# Patient Record
Sex: Male | Born: 1985 | Race: White | Hispanic: No | Marital: Married | Smoking: Current every day smoker
Health system: Southern US, Community
[De-identification: ages and names within clinical notes are randomized; demographics above are authoritative.]

## PROBLEM LIST (undated history)

## (undated) DIAGNOSIS — F1110 Opioid abuse, uncomplicated: Secondary | ICD-10-CM

---
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
## 2010-09-12 ENCOUNTER — Ambulatory Visit: Payer: Self-pay | Admitting: Nurse Practitioner

## 2011-06-22 ENCOUNTER — Encounter (HOSPITAL_COMMUNITY): Payer: Self-pay | Admitting: *Deleted

## 2011-06-22 ENCOUNTER — Emergency Department (HOSPITAL_COMMUNITY)
Admission: EM | Admit: 2011-06-22 | Discharge: 2011-06-22 | Disposition: A | Discharge status: Home / Self Care | Payer: Self-pay | Attending: Emergency Medicine | Admitting: Emergency Medicine

## 2011-06-22 DIAGNOSIS — L0231 Cutaneous abscess of buttock: Secondary | ICD-10-CM | POA: Insufficient documentation

## 2011-06-22 DIAGNOSIS — F17200 Nicotine dependence, unspecified, uncomplicated: Secondary | ICD-10-CM | POA: Insufficient documentation

## 2011-06-22 MED ORDER — IBUPROFEN 800 MG PO TABS
800.0000 mg | ORAL_TABLET | Freq: Once | ORAL | Status: AC
  Administered 2011-06-22: 800 mg via ORAL
  Filled 2011-06-22: qty 1

## 2011-06-22 MED ORDER — ONDANSETRON HCL 4 MG PO TABS
4.0000 mg | ORAL_TABLET | Freq: Once | ORAL | Status: AC
  Administered 2011-06-22: 4 mg via ORAL
  Filled 2011-06-22: qty 1

## 2011-06-22 MED ORDER — DOXYCYCLINE HYCLATE 100 MG PO TABS
100.0000 mg | ORAL_TABLET | Freq: Once | ORAL | Status: AC
  Administered 2011-06-22: 100 mg via ORAL
  Filled 2011-06-22: qty 1

## 2011-06-22 MED ORDER — DOXYCYCLINE HYCLATE 100 MG PO CAPS: 100.0000 mg | ORAL_CAPSULE | Freq: Two times a day (BID) | ORAL | Status: AC

## 2011-06-22 MED ORDER — AMOXICILLIN-POT CLAVULANATE 875-125 MG PO TABS
1.0000 | ORAL_TABLET | Freq: Once | ORAL | Status: AC
  Administered 2011-06-22: 1 via ORAL
  Filled 2011-06-22: qty 1

## 2011-06-22 MED ORDER — HYDROCODONE-ACETAMINOPHEN 5-325 MG PO TABS: 1.0000 | ORAL_TABLET | ORAL | Status: AC | PRN

## 2011-06-22 MED ORDER — HYDROCODONE-ACETAMINOPHEN 5-325 MG PO TABS
2.0000 | ORAL_TABLET | Freq: Once | ORAL | Status: AC
  Administered 2011-06-22: 2 via ORAL
  Filled 2011-06-22: qty 2

## 2011-06-22 MED ORDER — ONDANSETRON HCL 4 MG PO TABS
ORAL_TABLET | ORAL | Status: AC
  Administered 2011-06-22: 4 mg
  Filled 2011-06-22: qty 1

## 2011-06-22 NOTE — ED Notes (Signed)
1 zofran 4 mg was dropped in floor.

## 2011-06-22 NOTE — ED Notes (Signed)
Redness, swelling to lt buttock x4-5 days,  Has black 1/4center.

## 2011-06-22 NOTE — ED Provider Notes (Signed)
History     CSN: [id number]  Arrival date & time 06/22/11  1735   First MD Initiated Contact with Patient 06/22/11 1826      Chief Complaint  Patient presents with  . Abscess    (Consider location/radiation/quality/duration/timing/severity/associated sxs/prior treatment) Patient is a 26 y.o. male presenting with abscess. The history is provided by the patient and the spouse.  Abscess  This is a new problem. The current episode started less than one week ago. The problem occurs continuously. The problem has been gradually worsening. The abscess is present on the left buttock. The problem is moderate. The abscess is characterized by redness and painfulness. Associated symptoms include decreased physical activity. Pertinent negatives include no vomiting and no cough. There were no sick contacts. He has received no recent medical care.    History reviewed. No pertinent past medical history.  History reviewed. No pertinent past surgical history.  No family history on file.  History  Substance Use Topics  . Smoking status: Current Everyday Smoker -- 2.0 packs/day  . Smokeless tobacco: Not on file  . Alcohol Use:       Review of Systems  Constitutional: Negative for activity change.       All ROS Neg except as noted in HPI  HENT: Negative for nosebleeds and neck pain.   Eyes: Negative for photophobia and discharge.  Respiratory: Negative for cough, shortness of breath and wheezing.   Cardiovascular: Negative for chest pain and palpitations.  Gastrointestinal: Negative for vomiting, abdominal pain and blood in stool.  Genitourinary: Negative for dysuria, frequency and hematuria.  Musculoskeletal: Negative for back pain and arthralgias.  Skin: Negative.   Neurological: Negative for dizziness, seizures and speech difficulty.  Psychiatric/Behavioral: Negative for hallucinations and confusion.    Allergies  Sulfa antibiotics  Home Medications   Current Outpatient Rx  Name  Route Sig Dispense Refill  . DOXYCYCLINE HYCLATE 100 MG PO CAPS Oral Take 1 capsule (100 mg total) by mouth 2 (two) times daily. 14 capsule 0  . HYDROCODONE-ACETAMINOPHEN 5-325 MG PO TABS Oral Take 1 tablet by mouth every 4 (four) hours as needed for pain. 15 tablet 0    BP 128/85  Pulse 98  Temp(Src) 98.1 F (36.7 C) (Oral)  Resp 20  Ht 5\' 8"  (1.727 m)  Wt 130 lb (58.968 kg)  BMI 19.77 kg/m2  SpO2 99%  Physical Exam  Nursing note and vitals reviewed. Constitutional: He is oriented to person, place, and time. He appears well-developed and well-nourished.  Non-toxic appearance.  HENT:  Head: Normocephalic.  Right Ear: Tympanic membrane and external ear normal.  Left Ear: Tympanic membrane and external ear normal.  Eyes: EOM and lids are normal. Pupils are equal, round, and reactive to light.  Neck: Normal range of motion. Neck supple. Carotid bruit is not present.  Cardiovascular: Normal rate, regular rhythm, normal heart sounds, intact distal pulses and normal pulses.   Pulmonary/Chest: Breath sounds normal. No respiratory distress.  Abdominal: Soft. Bowel sounds are normal. There is no tenderness. There is no guarding.  Genitourinary:       Red, raised, tender area of the left buttox. Anus not involved.  Musculoskeletal: Normal range of motion.  Lymphadenopathy:       Head (right side): No submandibular adenopathy present.       Head (left side): No submandibular adenopathy present.    He has no cervical adenopathy.  Neurological: He is alert and oriented to person, place, and time. He has normal strength.  No cranial nerve deficit or sensory deficit.  Skin: Skin is warm and dry.  Psychiatric: He has a normal mood and affect. His speech is normal.    ED Course  Procedures (including critical care time)  Labs Reviewed - No data to display No results found.   1. Abscess of buttock, left       MDM  I have reviewed nursing notes, vital signs, and all appropriate lab  and imaging results for this patient. The patient has an abscess of the left buttocks. The area is red, warm, raised, and tender. Attempted to aspirate the area however the patient is severely fearful of needles, and requests to use warm soaks and antibiotics first. Patient understands the possible risk involved and he except these risks.  The patient is advised to use warm tub soaks twice daily, he's given prescription for doxycycline 100 mg twice daily, he's given a prescription for Norco every 4 hours as needed for pain.       Kathie Dike, [place] 06/30/11 [phone]

## 2011-06-22 NOTE — ED Notes (Signed)
Boil on buttocks for 4 days

## 2011-06-22 NOTE — Discharge Instructions (Signed)
Please note the abscess area in warm salt water 2 times daily for about 15 minutes each time. Please use doxycycline 2 times daily with food until all taken. Use 3 ibuprofen tablets 3 times daily with a meal, to improve swelling and inflammation. Norco every 4 hours as needed for pain. This medication may cause drowsiness, please use with caution. If the abscess continues to progress, please see the surgeon listed above for assistance and drainage.Abscess An abscess (boil or furuncle) is an infected area under your skin. This area is filled with yellowish white fluid (pus). HOME CARE   Only take medicine as told by your doctor.   Keep the skin clean around your abscess. Keep clothes that may touch the abscess clean.   Change any bandages (dressings) as told by your doctor.   Avoid direct skin contact with other people. The infection can spread by skin contact with others.   Practice good hygiene and do not share personal care items.   Do not share athletic equipment, towels, or whirlpools. Shower after every practice or work out session.   If a draining area cannot be covered:   Do not play sports.   Children should not go to daycare until the wound has healed or until fluid (drainage) stops coming out of the wound.   See your doctor for a follow-up visit as told.  GET HELP RIGHT AWAY IF:   There is more pain, puffiness (swelling), and redness in the wound site.   There is fluid or bleeding from the wound site.   You have muscle aches, chills, fever, or feel sick.   You or your child has a temperature by mouth above 102 F (38.9 C), not controlled by medicine.   Your baby is older than 3 months with a rectal temperature of 102 F (38.9 C) or higher.  MAKE SURE YOU:   Understand these instructions.   Will watch your condition.   Will get help right away if you are not doing well or get worse.  Document Released: 09/09/2007 Document Revised: 03/12/2011 Document Reviewed:  09/09/2007 [facility] Patient Information 2012 [place], [place].Marland Kitchen

## 2011-07-03 NOTE — ED Provider Notes (Signed)
Medical screening examination/treatment/procedure(s) were performed by non-physician practitioner and as supervising physician I was immediately available for consultation/collaboration.   Shelda Jakes, MD 07/03/11 [phone]

## 2013-11-26 ENCOUNTER — Emergency Department (HOSPITAL_COMMUNITY): Payer: Self-pay

## 2013-11-26 ENCOUNTER — Encounter (HOSPITAL_COMMUNITY): Payer: Self-pay | Admitting: Emergency Medicine

## 2013-11-26 ENCOUNTER — Emergency Department (HOSPITAL_COMMUNITY)
Admission: EM | Admit: 2013-11-26 | Discharge: 2013-11-26 | Disposition: A | Discharge status: Home / Self Care | Payer: Self-pay | Attending: Emergency Medicine | Admitting: Emergency Medicine

## 2013-11-26 DIAGNOSIS — S43302A Subluxation of unspecified parts of left shoulder girdle, initial encounter: Secondary | ICD-10-CM

## 2013-11-26 DIAGNOSIS — W010XXA Fall on same level from slipping, tripping and stumbling without subsequent striking against object, initial encounter: Secondary | ICD-10-CM | POA: Insufficient documentation

## 2013-11-26 DIAGNOSIS — S46909A Unspecified injury of unspecified muscle, fascia and tendon at shoulder and upper arm level, unspecified arm, initial encounter: Secondary | ICD-10-CM | POA: Insufficient documentation

## 2013-11-26 DIAGNOSIS — Z791 Long term (current) use of non-steroidal anti-inflammatories (NSAID): Secondary | ICD-10-CM | POA: Insufficient documentation

## 2013-11-26 DIAGNOSIS — S0993XA Unspecified injury of face, initial encounter: Secondary | ICD-10-CM | POA: Insufficient documentation

## 2013-11-26 DIAGNOSIS — S199XXA Unspecified injury of neck, initial encounter: Secondary | ICD-10-CM

## 2013-11-26 DIAGNOSIS — F17200 Nicotine dependence, unspecified, uncomplicated: Secondary | ICD-10-CM | POA: Insufficient documentation

## 2013-11-26 DIAGNOSIS — Y9301 Activity, walking, marching and hiking: Secondary | ICD-10-CM | POA: Insufficient documentation

## 2013-11-26 DIAGNOSIS — S4980XA Other specified injuries of shoulder and upper arm, unspecified arm, initial encounter: Secondary | ICD-10-CM | POA: Insufficient documentation

## 2013-11-26 DIAGNOSIS — S43006A Unspecified dislocation of unspecified shoulder joint, initial encounter: Secondary | ICD-10-CM | POA: Insufficient documentation

## 2013-11-26 DIAGNOSIS — Y92009 Unspecified place in unspecified non-institutional (private) residence as the place of occurrence of the external cause: Secondary | ICD-10-CM | POA: Insufficient documentation

## 2013-11-26 MED ORDER — NAPROXEN 250 MG PO TABS
500.0000 mg | ORAL_TABLET | Freq: Once | ORAL | Status: AC
  Administered 2013-11-26: 500 mg via ORAL
  Filled 2013-11-26: qty 2

## 2013-11-26 MED ORDER — NAPROXEN 500 MG PO TABS: 500.0000 mg | ORAL_TABLET | Freq: Two times a day (BID) | ORAL | Status: DC

## 2013-11-26 MED ORDER — HYDROCODONE-ACETAMINOPHEN 5-325 MG PO TABS: 1.0000 | ORAL_TABLET | Freq: Four times a day (QID) | ORAL | Status: DC | PRN

## 2013-11-26 MED ORDER — HYDROCODONE-ACETAMINOPHEN 5-325 MG PO TABS
1.0000 | ORAL_TABLET | Freq: Once | ORAL | Status: AC
  Administered 2013-11-26: 1 via ORAL
  Filled 2013-11-26: qty 1

## 2013-11-26 NOTE — Discharge Instructions (Signed)
Wear the shoulder immobilizer until you can be rechecked by the orthopedist on call, Dr Romeo Apple. Call his office tomorrow to get an appointment. Wear the shoulder immobilzer to stabilize your shoulder. Use ice packs to the shoulder. Take the medication as prescribed.  Shoulder Joint Subluxation  SYMPTOMS   A "popping" or tearing sensation felt at the time of injury.  Severe pain in the shoulder at the time of injury.  Tenderness, swelling, warmth or redness, and later bruising over and around the shoulder.  Shoulder weakness or inability to use the shoulder properly.  Changed contour of the shoulder either visually or felt by touch. This is more evident when trying to contract the muscle or lift the arm.  Loss of firm fullness when pushing on the area where the tendon ruptured (a defect between the end of the muscle and bone where they are separated from each other). CAUSES   Sudden episode of stressful over activity, particularly a major force to an already maximally contracted deltoid muscle.  Direct blow or injury.  Possibly, throwing.  Shoulder surgery (particularly on the rotator cuff). RISK INCREASES WITH:   Sports that require excessive deltoid muscle stress, especially throwing sports.  Contact sports.  Poor shoulder strength and flexibility.  Previous shoulder injury or surgery requiring movement of the deltoid.  Oral anabolic steroid use. PREVENTION  Warm up and stretch properly before activity.  Allow for rest and recovery between activities.  Maintain physical fitness:  Cardiovascular fitness.  Shoulder flexibility.  Muscle strength and endurance. PROGNOSIS  This condition is usually curable with early and appropriate treatment. RELATED COMPLICATIONS   Weakness of the shoulder, especially if untreated.  Re-rupture of the muscle after treatment.  Prolonged disability.  Risks of surgery, including infection, injury to nerves (numbness, weakness,  or paralysis), bleeding, hematoma, shoulder stiffness, shoulder weakness, pain with strenuous activity, and recurrent disruption.  Loss of shoulder contour.  Inability to repair tendons TREATMENT  Treatment initially involves using medicine and ice to reduce pain and inflammation. Heat therapy may be used for small injuries. Strength and stretching exercises may be recommended and may be performed at home or with a therapist. For large ruptures, surgical interventions are usually required.  In order to have the best likelihood of a good outcome with surgery, it is important for the injury to be treated within a few weeks of injury. After surgery, the shoulder may be immobilized for a period of time and physical therapy may follow. MEDICATION   If pain medicine is necessary, nonsteroidal anti-inflammatory medicines, such as aspirin and ibuprofen, or other minor pain relievers, such as acetaminophen, are often recommended.  Do not take pain medicine for 7 days before surgery.  Prescription pain relievers may be given if determined necessary by your caregiver. Use only as directed and only as much as you need. COLD THERAPY  Cold treatment (icing) relieves pain and reduces inflammation. Cold treatment should be applied for 10 to 15 minutes every 2 to 3 hours for inflammation and pain and immediately after any activity that aggravates your symptoms. Use ice packs or an ice massage. SEEK MEDICAL CARE IF:   Pain increases, despite treatment.  Any of the following occur after surgery:  Signs of infection, including fever, increased pain, swelling, redness, drainage, or bleeding in the surgical area.  New, unexplained symptoms develop. Drugs used in treatment may produce side effects. Document Released: 03/23/2005 Document Revised: 06/15/2011 Document Reviewed: 07/05/2008 [facility] Patient Information 2015 [place], [place]. This information is not  intended to replace advice given to you by your health  care provider. Make sure you discuss any questions you have with your health care provider.

## 2013-11-26 NOTE — ED Notes (Addendum)
Pt states that he tripped over his son's skateboard landing on left shoulder, left neck area, denies any LOC, c/o pain to left shoulder and left neck area, states that the fall occurred 4 hours ago, cms intact distal, pt left shoulder lower than right shoulder, l,

## 2013-11-26 NOTE — ED Provider Notes (Signed)
CSN: [id number]     Arrival date & time 11/26/13  1414 History  This chart was scribed for Ward Givens, MD by Elon Spanner, ED Scribe. This patient was seen in room APA10/APA10 and the patient's care was started at 2:42 PM.    Chief Complaint  Patient presents with  . Fall   The history is provided by the patient. No language interpreter was used.    HPI Comments: Logan Wilkins is a 28 y.o. male who presents to the Emergency Department complaining of a mechanical fall 3-4 hours ago.  Patient states he tripped over his son's skateboard while walking in the back door of his house, landing on his left shoulder. He reports he can lift his left arm but states his shoulder pops when he lets his arm fall back down.  Patient reports associated left sided neck pain.  Patient denies LOC, head trauma.  Patient denies previous injury to left shoulder. He denies numbness or tingling. Pt is right handed.    PCP: none   History reviewed. No pertinent past medical history. History reviewed. No pertinent past surgical history. No family history on file. History  Substance Use Topics  . Smoking status: Current Every Day Smoker -- 2.00 packs/day  . Smokeless tobacco: Not on file  . Alcohol Use: No  employed Smokes up to 4 ppd, but minimum of 2 ppd  Review of Systems  Musculoskeletal: Positive for arthralgias and neck pain.  All other systems reviewed and are negative.     Allergies  Sulfa antibiotics  Home Medications   Prior to Admission medications   Medication Sig Start Date End Date Taking? Authorizing Provider  HYDROcodone-acetaminophen (NORCO/VICODIN) 5-325 MG per tablet Take 1 tablet by mouth every 6 (six) hours as needed for moderate pain. 11/26/13   Ward Givens, MD  naproxen (NAPROSYN) 500 MG tablet Take 1 tablet (500 mg total) by mouth 2 (two) times daily. 11/26/13   Ward Givens, MD   BP 125/90  Pulse 83  Temp(Src) 97.9 F (36.6 C) (Oral)  Resp 18  Ht  (1.727 m)  Wt 130  lb (58.968 kg)  BMI 19.77 kg/m2  SpO2 100%  Vital signs normal   Physical Exam  Nursing note and vitals reviewed. Constitutional: He is oriented to person, place, and time. He appears well-developed and well-nourished.  Non-toxic appearance. He does not appear ill. No distress.  HENT:  Head: Normocephalic and atraumatic.  Right Ear: External ear normal.  Left Ear: External ear normal.  Nose: Nose normal. No mucosal edema or rhinorrhea.  Mouth/Throat: Oropharynx is clear and moist and mucous membranes are normal. No dental abscesses or uvula swelling.  Eyes: Conjunctivae and EOM are normal. Pupils are equal, round, and reactive to light.  Neck: Normal range of motion and full passive range of motion without pain. Neck supple.    Cardiovascular: Normal rate and regular rhythm.   Pulmonary/Chest: Effort normal. No respiratory distress. He has no rhonchi. He exhibits no crepitus.  Abdominal: Soft. Normal appearance and bowel sounds are normal. He exhibits no distension. There is no tenderness. There is no rebound and no guarding.  Musculoskeletal: He exhibits tenderness. He exhibits no edema.  Tenderness to left paraspinous muscles of c-spine.  Tenderness in left AC joint with no obvious deformity or swelling.  However, when he does adduction of left arm, his shoulder appears to sublux and an audible pop is heard when it goes back into joint.  Neurological: He is alert and oriented to person, place, and time. He has normal strength. No cranial nerve deficit.  Skin: Skin is warm, dry and intact. No rash noted. No erythema. No pallor.  Psychiatric: He has a normal mood and affect. His speech is normal and behavior is normal. His mood appears not anxious.    ED Course  Procedures (including critical care time) Medications  naproxen (NAPROSYN) tablet 500 mg (500 mg Oral Given 11/26/13 1459)  HYDROcodone-acetaminophen (NORCO/VICODIN) 5-325 MG per tablet 1 tablet (1 tablet Oral Given 11/26/13  1459)     DIAGNOSTIC STUDIES: Oxygen Saturation is 100% on RA, normal by my interpretation.    COORDINATION OF CARE:  2:50 PM Discussed treatment plan with patient at bedside.  Patient acknowledges and agrees with plan.    Pt placed in a shoulder immobilizer after getting ACJ xrays with weights. His x-rays are normal. Patient must have a ligamentous injury of his shoulder that allows subluxation without dislocation. He is being referred to an orthopedist.  Labs Review No results found for this or any previous visit. No results found.    Imaging Review Dg Ac Joints  11/26/2013   CLINICAL DATA:  Left shoulder discomfort status post trauma  EXAM: LEFT ACROMIOCLAVICULAR JOINTS  COMPARISON:  Left shoulder series of today's date.  FINDINGS: Films were obtained of both shoulders without and with weights. The AC joints appears stable bilaterally. There is no acute fracture of the clavicles nor the visualized portions of the scapulae.  IMPRESSION: There is no evidence of ligamentous instability of the acromioclavicular joints.   Electronically Signed   By: David  [place]   On: 11/26/2013 15:28   Dg Shoulder Left  11/26/2013   CLINICAL DATA:  Pain post trauma  EXAM: LEFT SHOULDER - 2+ VIEW  COMPARISON:  None.  FINDINGS: Frontal, Y scapular, and axillary images were obtained. There is no fracture or dislocation. Joint spaces appear intact. No erosive change.  IMPRESSION: No fracture or appreciable arthropathy.   Electronically Signed   By: Bretta Bang M.D.   On: 11/26/2013 14:41     EKG Interpretation None      MDM   Final diagnoses:  Subluxation of shoulder girdle, left, initial encounter    Discharge Medication List as of 11/26/2013  3:38 PM    START taking these medications   Details  HYDROcodone-acetaminophen (NORCO/VICODIN) 5-325 MG per tablet Take 1 tablet by mouth every 6 (six) hours as needed for moderate pain., Starting 11/26/2013, Until Discontinued, Print    naproxen  (NAPROSYN) 500 MG tablet Take 1 tablet (500 mg total) by mouth 2 (two) times daily., Starting 11/26/2013, Until Discontinued, Print        Plan discharge  Devoria Albe, MD, FACEP    I personally performed the services described in this documentation, which was scribed in my presence. The recorded information has been reviewed and considered.  Devoria Albe, MD, Armando Gang    Ward Givens, MD 11/26/13 2139

## 2013-11-27 ENCOUNTER — Telehealth: Payer: Self-pay | Admitting: Orthopedic Surgery

## 2013-11-27 NOTE — Telephone Encounter (Signed)
Patient (wife, Elease Hashimoto) called to inquire about appointment following Emergency Room visit at [facility] for left shoulder injury.  Offered appointment, and discussed self-pay information; states she and husband(patient) will call back regarding arrangements for the appointment; also states applying for Medicaid.  Appointment pending.  Ph# [phone]

## 2013-12-05 ENCOUNTER — Encounter (HOSPITAL_COMMUNITY): Payer: Self-pay | Admitting: Emergency Medicine

## 2013-12-05 ENCOUNTER — Emergency Department (HOSPITAL_COMMUNITY)
Admission: EM | Admit: 2013-12-05 | Discharge: 2013-12-05 | Disposition: A | Discharge status: Home / Self Care | Payer: Self-pay | Attending: Emergency Medicine | Admitting: Emergency Medicine

## 2013-12-05 DIAGNOSIS — M25512 Pain in left shoulder: Secondary | ICD-10-CM

## 2013-12-05 DIAGNOSIS — Z791 Long term (current) use of non-steroidal anti-inflammatories (NSAID): Secondary | ICD-10-CM | POA: Insufficient documentation

## 2013-12-05 DIAGNOSIS — F17200 Nicotine dependence, unspecified, uncomplicated: Secondary | ICD-10-CM | POA: Insufficient documentation

## 2013-12-05 DIAGNOSIS — M25519 Pain in unspecified shoulder: Secondary | ICD-10-CM | POA: Insufficient documentation

## 2013-12-05 MED ORDER — CYCLOBENZAPRINE HCL 10 MG PO TABS: 10.0000 mg | ORAL_TABLET | Freq: Two times a day (BID) | ORAL | Status: DC | PRN

## 2013-12-05 NOTE — ED Provider Notes (Signed)
Medical screening examination/treatment/procedure(s) were performed by non-physician practitioner and as supervising physician I was immediately available for consultation/collaboration.   EKG Interpretation None      Devoria Albe, MD, Armando Gang   Ward Givens, MD 12/05/13 2051

## 2013-12-05 NOTE — Discharge Instructions (Signed)
Apply ice to the area, take the Naprosyn and the the medication we give you today. Do not drive while taking the medication as it will make you sleepy. Follow up with DR. Keeling or return here as needed.

## 2013-12-05 NOTE — ED Provider Notes (Signed)
CSN: [id number]     Arrival date & time 12/05/13  1731 History   First MD Initiated Contact with Patient 12/05/13 1758     Chief Complaint  Patient presents with  . Shoulder Pain     (Consider location/radiation/quality/duration/timing/severity/associated sxs/prior Treatment) Patient is a 28 y.o. male presenting with shoulder pain. The history is provided by the patient.  Shoulder Pain  Logan Wilkins is a 28 y.o. male who presents to the ED with continued shoulder pain. He was evaluated 8/23 for left shoulder pain and treated with Norco and Naprosyn. He called Dr. Mort Sawyers office for follow up but did not have the money to go for an appointment. He continues to have the pain and is out of his Norco. He is requesting pain medication.   History reviewed. No pertinent past medical history. History reviewed. No pertinent past surgical history. History reviewed. No pertinent family history. History  Substance Use Topics  . Smoking status: Current Every Day Smoker -- 2.00 packs/day  . Smokeless tobacco: Not on file  . Alcohol Use: Yes    Review of Systems Negative except as stated in HPI   Allergies  Sulfa antibiotics  Home Medications   Prior to Admission medications   Medication Sig Start Date End Date Taking? Authorizing Provider  HYDROcodone-acetaminophen (NORCO/VICODIN) 5-325 MG per tablet Take 1 tablet by mouth every 6 (six) hours as needed for moderate pain. 11/26/13   Ward Givens, MD  naproxen (NAPROSYN) 500 MG tablet Take 1 tablet (500 mg total) by mouth 2 (two) times daily. 11/26/13   Ward Givens, MD   BP 140/82  Pulse 96  Temp(Src) 98.3 F (36.8 C) (Oral)  Resp 18  Ht  (1.727 m)  Wt 130 lb (58.968 kg)  BMI 19.77 kg/m2  SpO2 100% Physical Exam  Nursing note and vitals reviewed. Constitutional: He is oriented to person, place, and time. He appears well-developed and well-nourished.  HENT:  Head: Normocephalic.  Eyes: EOM are normal.  Neck: Neck supple.    Cardiovascular: Normal rate.   Pulmonary/Chest: Effort normal.  Musculoskeletal:       Left shoulder: He exhibits tenderness.       Arms: There is tenderness and muscle spasm noted on the left side of the neck and into the SCM area. Radial pulses equal, adequate circulation, good touch sensation. Good grips and equal. Pain with range of motion. He is able to lift his arm above his head and bring it to the front of his body. He has pain with trying to put his left arm behind his back.   Neurological: He is alert and oriented to person, place, and time. No cranial nerve deficit.  Skin: Skin is warm and dry.  Psychiatric: He has a normal mood and affect. His behavior is normal.    ED Course  Procedures MDM  28 y.o. male with continued left shoulder pain since previous visit. Will treat for muscle spasm and he will try to arrange follow up with ortho. He will continue to take the Naprosyn for inflammation. Discussed with the patient and all questioned fully answered. Stable for discharge without neurovascular deficits.    Medication List    TAKE these medications       cyclobenzaprine 10 MG tablet  Commonly known as:  FLEXERIL  Take 1 tablet (10 mg total) by mouth 2 (two) [id number] as needed for muscle spasms.      ASK your doctor about these medications  HYDROcodone-acetaminophen 5-325 MG per tablet  Commonly known as:  NORCO/VICODIN  Take 1 tablet by mouth every 6 (six) hours as needed for moderate pain.     naproxen 500 MG tablet  Commonly known as:  NAPROSYN  Take 1 tablet (500 mg total) by mouth 2 (two) times daily.            Logan Wilkins, [place] 12/05/13 [phone]

## 2013-12-05 NOTE — ED Notes (Signed)
Pain lt shoulder , seen here 8/23 for same. Referred to ortho , but says he cannot afford that.  Wants something for pain.

## 2013-12-06 NOTE — Telephone Encounter (Signed)
No further response from patient. °

## 2014-06-24 ENCOUNTER — Emergency Department (HOSPITAL_COMMUNITY): Payer: Self-pay

## 2014-06-24 ENCOUNTER — Emergency Department (HOSPITAL_COMMUNITY)
Admission: EM | Admit: 2014-06-24 | Discharge: 2014-06-24 | Disposition: A | Discharge status: Home / Self Care | Payer: Self-pay | Attending: Emergency Medicine | Admitting: Emergency Medicine

## 2014-06-24 ENCOUNTER — Encounter (HOSPITAL_COMMUNITY): Payer: Self-pay | Admitting: *Deleted

## 2014-06-24 DIAGNOSIS — Y9289 Other specified places as the place of occurrence of the external cause: Secondary | ICD-10-CM | POA: Insufficient documentation

## 2014-06-24 DIAGNOSIS — Z720 Tobacco use: Secondary | ICD-10-CM | POA: Insufficient documentation

## 2014-06-24 DIAGNOSIS — M25512 Pain in left shoulder: Secondary | ICD-10-CM

## 2014-06-24 DIAGNOSIS — Y9351 Activity, roller skating (inline) and skateboarding: Secondary | ICD-10-CM | POA: Insufficient documentation

## 2014-06-24 DIAGNOSIS — S4992XA Unspecified injury of left shoulder and upper arm, initial encounter: Secondary | ICD-10-CM | POA: Insufficient documentation

## 2014-06-24 DIAGNOSIS — Y998 Other external cause status: Secondary | ICD-10-CM | POA: Insufficient documentation

## 2014-06-24 DIAGNOSIS — W010XXA Fall on same level from slipping, tripping and stumbling without subsequent striking against object, initial encounter: Secondary | ICD-10-CM

## 2014-06-24 MED ORDER — OXYCODONE-ACETAMINOPHEN 5-325 MG PO TABS
1.0000 | ORAL_TABLET | Freq: Once | ORAL | Status: AC
  Administered 2014-06-24: 1 via ORAL

## 2014-06-24 MED ORDER — OXYCODONE-ACETAMINOPHEN 5-325 MG PO TABS
ORAL_TABLET | ORAL | Status: AC
  Filled 2014-06-24: qty 1

## 2014-06-24 NOTE — Discharge Instructions (Signed)
Apply ice to your shoulder. Continue to take ibuprofen, naproxen or tylenol for pain.  Shoulder Pain The shoulder is the joint that connects your arms to your body. The bones that form the shoulder joint include the upper arm bone (humerus), the shoulder blade (scapula), and the collarbone (clavicle). The top of the humerus is shaped like a ball and fits into a rather flat socket on the scapula (glenoid cavity). A combination of muscles and strong, fibrous tissues that connect muscles to bones (tendons) support your shoulder joint and hold the ball in the socket. Small, fluid-filled sacs (bursae) are located in different areas of the joint. They act as cushions between the bones and the overlying soft tissues and help reduce friction between the gliding tendons and the bone as you move your arm. Your shoulder joint allows a wide range of motion in your arm. This range of motion allows you to do things like scratch your back or throw a ball. However, this range of motion also makes your shoulder more prone to pain from overuse and injury. Causes of shoulder pain can originate from both injury and overuse and usually can be grouped in the following four categories:  Redness, swelling, and pain (inflammation) of the tendon (tendinitis) or the bursae (bursitis).  Instability, such as a dislocation of the joint.  Inflammation of the joint (arthritis).  Broken bone (fracture). HOME CARE INSTRUCTIONS   Apply ice to the sore area.  Put ice in a plastic bag.  Place a towel between your skin and the bag.  Leave the ice on for 15-20 minutes, 3-4 times per day for the first 2 days, or as directed by your health care provider.  Stop using cold packs if they do not help with the pain.  If you have a shoulder sling or immobilizer, wear it as long as your caregiver instructs. Only remove it to shower or bathe. Move your arm as little as possible, but keep your hand moving to prevent swelling.  Squeeze a  soft ball or foam pad as much as possible to help prevent swelling.  Only take over-the-counter or prescription medicines for pain, discomfort, or fever as directed by your caregiver. SEEK MEDICAL CARE IF:   Your shoulder pain increases, or new pain develops in your arm, hand, or fingers.  Your hand or fingers become cold and numb.  Your pain is not relieved with medicines. SEEK IMMEDIATE MEDICAL CARE IF:   Your arm, hand, or fingers are numb or tingling.  Your arm, hand, or fingers are significantly swollen or turn white or blue. MAKE SURE YOU:   Understand these instructions.  Will watch your condition.  Will get help right away if you are not doing well or get worse. Document Released: 12/31/2004 Document Revised: 08/07/2013 Document Reviewed: 03/07/2011 [facility] Patient Information 2015 [employer] [place]. This information is not intended to replace advice given to you by your health care provider. Make sure you discuss any questions you have with your health care provider.

## 2014-06-24 NOTE — ED Provider Notes (Signed)
CSN: [id number]     Arrival date & time 06/24/14  1434 History  This chart was scribed for non-physician practitioner, Celene Skeen, PA-C,working with Eber Hong, MD, by Karle Plumber, ED Scribe. This patient was seen in room TR08C/TR08C and the patient's care was started at 4:21 PM.  Chief Complaint  Patient presents with  . Fall  . Shoulder Pain   Patient is a 29 y.o. male presenting with fall and shoulder pain. The history is provided by the patient and medical records. No language interpreter was used.  Fall  Shoulder Pain   HPI Comments:  Logan Wilkins is a 29 y.o. male who presents to the Emergency Department complaining of severe left shoulder pain secondary to stepping on a skateboard and falling directly on the shoulder approximately four hours ago. He reports associated left hand paresthesia. He took Naproxen PTA with no significant relief of the pain. Holding the arm still helps to alleviate the pain. Movement of the arm exacerbates the pain. Pt reports h/o past subluxation. Denies numbness, bruising, swelling, wounds, nausea, vomiting, head trauma or LOC.    History reviewed. No pertinent past medical history. History reviewed. No pertinent past surgical history. History reviewed. No pertinent family history. History  Substance Use Topics  . Smoking status: Current Every Day Smoker -- 2.00 packs/day  . Smokeless tobacco: Not on file  . Alcohol Use: Yes    Review of Systems  Gastrointestinal: Negative for nausea and vomiting.  Musculoskeletal: Positive for myalgias.  Skin: Negative for color change and wound.  Neurological: Negative for syncope, weakness and numbness.  All other systems reviewed and are negative.   Allergies  Sulfa antibiotics  Home Medications   Prior to Admission medications   Medication Sig Start Date End Date Taking? Authorizing Provider  cyclobenzaprine (FLEXERIL) 10 MG tablet Take 1 tablet (10 mg total) by mouth 2 (two) times daily as  needed for muscle spasms. 12/05/13   Hope Orlene Och, NP  HYDROcodone-acetaminophen (NORCO/VICODIN) 5-325 MG per tablet Take 1 tablet by mouth every 6 (six) hours as needed for moderate pain. 11/26/13   Devoria Albe, MD  naproxen (NAPROSYN) 500 MG tablet Take 1 tablet (500 mg total) by mouth 2 (two) times daily. 11/26/13   Devoria Albe, MD   Triage Vitals: BP 130/84 mmHg  Pulse 98  Temp(Src) 98.7 F (37.1 C) (Oral)  Resp 18  SpO2 98% Physical Exam  Constitutional: He is oriented to person, place, and time. He appears well-developed and well-nourished. No distress.  HENT:  Head: Normocephalic and atraumatic.  Eyes: Conjunctivae and EOM are normal.  Neck: Normal range of motion. Neck supple.  Cardiovascular: Normal rate, regular rhythm and normal heart sounds.   Pulmonary/Chest: Effort normal and breath sounds normal.  Musculoskeletal: He exhibits no edema.  L shoulder TTP throughout. No bruising or signs of trauma. No swelling or deformity. Pt using muscle strength to prevent me to perform passive ROM. Clavicle normal. Elbow normal.  Neurological: He is alert and oriented to person, place, and time.  Skin: Skin is warm and dry.  Psychiatric: He has a normal mood and affect. His behavior is normal.  Nursing note and vitals reviewed.   ED Course  Procedures (including critical care time) DIAGNOSTIC STUDIES: Oxygen Saturation is 98% on RA, normal by my interpretation.   COORDINATION OF CARE: 4:24 PM- Before leaving the room pt asks why his shoulder makes a popping sound and proceeds to fully rotate his shoulder with FROM until it makes  a popping sound. Pt verbalizes understanding and agrees to plan.  Medications  oxyCODONE-acetaminophen (PERCOCET/ROXICET) 5-325 MG per tablet (not administered)  oxyCODONE-acetaminophen (PERCOCET/ROXICET) 5-325 MG per tablet 1 tablet (1 tablet Oral Given 06/24/14 1506)    Labs Review Labs Reviewed - No data to display  Imaging Review Dg Shoulder  Left  06/24/2014   CLINICAL DATA:  Fall from skateboard with left shoulder pain  EXAM: LEFT SHOULDER - 2+ VIEW  COMPARISON:  11/26/2013  FINDINGS: There is no evidence of fracture or dislocation. There is no evidence of arthropathy or other focal bone abnormality. Soft tissues are unremarkable.  IMPRESSION: No acute abnormality noted.   Electronically Signed   By: Alcide CleverMark  Lukens M.D.   On: 06/24/2014 15:43     EKG Interpretation None      MDM   Final diagnoses:  Fall from slip, trip, or stumble, initial encounter  Left shoulder pain   NAD. Neurovascularly intact. Xray without acute finding. Discussed results with patient. Using muscle strength to prevent ROM, however when I was about to leave room, he asks why his shoulder makes a popping sound as stated above, swings his arm without any difficulty to make the sound. I advised him to f/u with PCP and possibly ortho if symptoms do not improve. Stable for d/c. Ice and NSAIDs. Return precautions given. Patient states understanding of treatment care plan and is agreeable.  I personally performed the services described in this documentation, which was scribed in my presence. The recorded information has been reviewed and is accurate.    Kathrynn SpeedRobyn M Jaleeya Mcnelly, PA-C 06/24/14 1707  Eber HongBrian Miller, MD 06/25/14 [phone]

## 2014-06-24 NOTE — ED Notes (Signed)
Pt reports tripping over skateboard this am, having left shoulder pain, difficulty moving his arm.

## 2014-06-24 NOTE — ED Notes (Signed)
Declined W/C at D/C and was escorted to lobby by RN. 

## 2014-07-29 ENCOUNTER — Emergency Department
Admit: 2014-07-29 | Disposition: A | Discharge status: Home / Self Care | Payer: Self-pay | Admitting: Emergency Medicine

## 2015-11-30 ENCOUNTER — Emergency Department
Admission: EM | Admit: 2015-11-30 | Discharge: 2015-11-30 | Disposition: A | Discharge status: Home / Self Care | Payer: Self-pay | Attending: Emergency Medicine | Admitting: Emergency Medicine

## 2015-11-30 ENCOUNTER — Emergency Department: Payer: Self-pay

## 2015-11-30 DIAGNOSIS — Y9389 Activity, other specified: Secondary | ICD-10-CM | POA: Insufficient documentation

## 2015-11-30 DIAGNOSIS — Y999 Unspecified external cause status: Secondary | ICD-10-CM | POA: Insufficient documentation

## 2015-11-30 DIAGNOSIS — F17210 Nicotine dependence, cigarettes, uncomplicated: Secondary | ICD-10-CM | POA: Insufficient documentation

## 2015-11-30 DIAGNOSIS — IMO0002 Reserved for concepts with insufficient information to code with codable children: Secondary | ICD-10-CM

## 2015-11-30 DIAGNOSIS — W25XXXA Contact with sharp glass, initial encounter: Secondary | ICD-10-CM | POA: Insufficient documentation

## 2015-11-30 DIAGNOSIS — S51811A Laceration without foreign body of right forearm, initial encounter: Secondary | ICD-10-CM | POA: Insufficient documentation

## 2015-11-30 DIAGNOSIS — S61235A Puncture wound without foreign body of left ring finger without damage to nail, initial encounter: Secondary | ICD-10-CM | POA: Insufficient documentation

## 2015-11-30 DIAGNOSIS — R202 Paresthesia of skin: Secondary | ICD-10-CM

## 2015-11-30 DIAGNOSIS — T148XXA Other injury of unspecified body region, initial encounter: Secondary | ICD-10-CM

## 2015-11-30 DIAGNOSIS — Y92009 Unspecified place in unspecified non-institutional (private) residence as the place of occurrence of the external cause: Secondary | ICD-10-CM | POA: Insufficient documentation

## 2015-11-30 MED ORDER — OXYCODONE-ACETAMINOPHEN 5-325 MG PO TABS
1.0000 | ORAL_TABLET | Freq: Once | ORAL | Status: AC
  Administered 2015-11-30: 1 via ORAL
  Filled 2015-11-30: qty 1

## 2015-11-30 MED ORDER — LIDOCAINE-EPINEPHRINE 2 %-1:100000 IJ SOLN
20.0000 mL | Freq: Once | INTRAMUSCULAR | Status: DC
  Filled 2015-11-30: qty 20

## 2015-11-30 MED ORDER — LIDOCAINE-EPINEPHRINE (PF) 1 %-1:200000 IJ SOLN: 30.0000 mL | Freq: Once | INTRAMUSCULAR | Status: DC

## 2015-11-30 MED ORDER — LIDOCAINE-EPINEPHRINE (PF) 1 %-1:200000 IJ SOLN
INTRAMUSCULAR | Status: AC
  Filled 2015-11-30: qty 30

## 2015-11-30 NOTE — Discharge Instructions (Signed)
You may shower, but do not soak the arm with your stitches. Please apply Neosporin or any triple antibiotic cream and a thick coat 3 times daily to decrease the risk of infection. Please keep your lacerations clean and dry.  Make an appointment with a primary care physician for suture removal and wound check in 7-10 days.  You may take Tylenol or Motrin for pain.  Seek immediate medical attention if you develop drainage of pus, increasing redness, swelling, increasing pain, fever, or any other symptoms concerning to you.

## 2015-11-30 NOTE — ED Triage Notes (Signed)
Pt came to ED via EMS. Pt was taking window pane out and glass broke. Pt has laceration to right forearm. Small puncture wound to 4th finger on left hand. Right arm wrapped by EMS.
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
## 2015-12-17 ENCOUNTER — Encounter: Payer: Self-pay | Admitting: *Deleted

## 2015-12-17 ENCOUNTER — Emergency Department: Payer: Self-pay

## 2015-12-17 ENCOUNTER — Emergency Department
Admission: EM | Admit: 2015-12-17 | Discharge: 2015-12-18 | Disposition: A | Discharge status: Still In Hospital | Payer: Self-pay | Attending: Emergency Medicine | Admitting: Emergency Medicine

## 2015-12-17 DIAGNOSIS — S0003XA Contusion of scalp, initial encounter: Secondary | ICD-10-CM | POA: Insufficient documentation

## 2015-12-17 DIAGNOSIS — S0990XA Unspecified injury of head, initial encounter: Secondary | ICD-10-CM | POA: Insufficient documentation

## 2015-12-17 DIAGNOSIS — F39 Unspecified mood [affective] disorder: Secondary | ICD-10-CM | POA: Insufficient documentation

## 2015-12-17 DIAGNOSIS — F1210 Cannabis abuse, uncomplicated: Secondary | ICD-10-CM | POA: Insufficient documentation

## 2015-12-17 DIAGNOSIS — F1010 Alcohol abuse, uncomplicated: Secondary | ICD-10-CM | POA: Insufficient documentation

## 2015-12-17 DIAGNOSIS — X58XXXA Exposure to other specified factors, initial encounter: Secondary | ICD-10-CM | POA: Insufficient documentation

## 2015-12-17 DIAGNOSIS — F17210 Nicotine dependence, cigarettes, uncomplicated: Secondary | ICD-10-CM | POA: Insufficient documentation

## 2015-12-17 DIAGNOSIS — R45851 Suicidal ideations: Secondary | ICD-10-CM | POA: Insufficient documentation

## 2015-12-17 LAB — COMPREHENSIVE METABOLIC PANEL
ALBUMIN: 4.5 g/dL (ref 3.5–5.0)
ALK PHOS: 88 U/L (ref 38–126)
ALT: 33 U/L (ref 17–63)
AST: 48 U/L — AB (ref 15–41)
Anion gap: 7 (ref 5–15)
BILIRUBIN TOTAL: 0.6 mg/dL (ref 0.3–1.2)
BUN: 10 mg/dL (ref 6–20)
CALCIUM: 8.8 mg/dL — AB (ref 8.9–10.3)
CO2: 24 mmol/L (ref 22–32)
Chloride: 108 mmol/L (ref 101–111)
Creatinine, Ser: 0.84 mg/dL (ref 0.61–1.24)
GFR calc Af Amer: >60 mL/min (ref >60)
GFR calc non Af Amer: >60 mL/min (ref >60)
GLUCOSE: 103 mg/dL — AB (ref 65–99)
Potassium: 3.3 mmol/L — ABNORMAL LOW (ref 3.5–5.1)
SODIUM: 139 mmol/L (ref 135–145)
TOTAL PROTEIN: 8 g/dL (ref 6.5–8.1)

## 2015-12-17 LAB — CBC WITH DIFFERENTIAL/PLATELET
BASOS ABS: 0.1 10*3/uL (ref 0–0.1)
BASOS PCT: 0 %
Eosinophils Absolute: 0 10*3/uL (ref 0–0.7)
Eosinophils Relative: 0 %
HEMATOCRIT: 46.2 % (ref 40.0–52.0)
HEMOGLOBIN: 16 g/dL (ref 13.0–18.0)
LYMPHS PCT: 12 %
Lymphs Abs: 1.8 10*3/uL (ref 1.0–3.6)
MCH: 32.2 pg (ref 26.0–34.0)
MCHC: 34.7 g/dL (ref 32.0–36.0)
MCV: 92.8 fL (ref 80.0–100.0)
MONO ABS: 1.1 10*3/uL — AB (ref 0.2–1.0)
MONOS PCT: 7 %
NEUTROS ABS: 11.7 10*3/uL — AB (ref 1.4–6.5)
NEUTROS PCT: 81 %
Platelets: 228 10*3/uL (ref 150–440)
RBC: 4.98 MIL/uL (ref 4.40–5.90)
RDW: 13.2 % (ref 11.5–14.5)
WBC: 14.6 10*3/uL — ABNORMAL HIGH (ref 3.8–10.6)

## 2015-12-17 LAB — TROPONIN I: Troponin I: <0.03 ng/mL (ref <0.03)

## 2015-12-17 LAB — ETHANOL: Alcohol, Ethyl (B): 186 mg/dL — ABNORMAL HIGH (ref <5)

## 2015-12-17 LAB — LIPASE, BLOOD: Lipase: 34 U/L (ref 11–51)

## 2015-12-17 LAB — SALICYLATE LEVEL

## 2015-12-17 LAB — ACETAMINOPHEN LEVEL: Acetaminophen (Tylenol), Serum: <10 ug/mL — ABNORMAL LOW (ref 10–30)

## 2015-12-17 NOTE — ED Notes (Signed)
MD at bedside. 
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
## 2015-12-18 ENCOUNTER — Inpatient Hospital Stay
Admission: AD | Admit: 2015-12-18 | Discharge: 2015-12-21 | DRG: 881 | Disposition: A | Discharge status: Home / Self Care | Payer: No Typology Code available for payment source | Source: Intra-hospital | Attending: Psychiatry | Admitting: Psychiatry

## 2015-12-18 DIAGNOSIS — F329 Major depressive disorder, single episode, unspecified: Secondary | ICD-10-CM | POA: Diagnosis not present

## 2015-12-18 DIAGNOSIS — F1220 Cannabis dependence, uncomplicated: Secondary | ICD-10-CM

## 2015-12-18 DIAGNOSIS — F17210 Nicotine dependence, cigarettes, uncomplicated: Secondary | ICD-10-CM | POA: Diagnosis present

## 2015-12-18 DIAGNOSIS — Z882 Allergy status to sulfonamides status: Secondary | ICD-10-CM | POA: Diagnosis not present

## 2015-12-18 DIAGNOSIS — F1020 Alcohol dependence, uncomplicated: Secondary | ICD-10-CM | POA: Diagnosis present

## 2015-12-18 DIAGNOSIS — Y906 Blood alcohol level of 120-199 mg/100 ml: Secondary | ICD-10-CM | POA: Diagnosis present

## 2015-12-18 DIAGNOSIS — Z599 Problem related to housing and economic circumstances, unspecified: Secondary | ICD-10-CM | POA: Diagnosis not present

## 2015-12-18 DIAGNOSIS — Z630 Problems in relationship with spouse or partner: Secondary | ICD-10-CM

## 2015-12-18 DIAGNOSIS — R440 Auditory hallucinations: Secondary | ICD-10-CM | POA: Diagnosis present

## 2015-12-18 DIAGNOSIS — F1120 Opioid dependence, uncomplicated: Secondary | ICD-10-CM

## 2015-12-18 DIAGNOSIS — F1994 Other psychoactive substance use, unspecified with psychoactive substance-induced mood disorder: Secondary | ICD-10-CM

## 2015-12-18 DIAGNOSIS — R45851 Suicidal ideations: Secondary | ICD-10-CM | POA: Diagnosis present

## 2015-12-18 DIAGNOSIS — G4700 Insomnia, unspecified: Secondary | ICD-10-CM | POA: Diagnosis present

## 2015-12-18 DIAGNOSIS — F1123 Opioid dependence with withdrawal: Secondary | ICD-10-CM | POA: Diagnosis present

## 2015-12-18 DIAGNOSIS — F17200 Nicotine dependence, unspecified, uncomplicated: Secondary | ICD-10-CM

## 2015-12-18 DIAGNOSIS — F1193 Opioid use, unspecified with withdrawal: Secondary | ICD-10-CM

## 2015-12-18 DIAGNOSIS — F32A Depression, unspecified: Secondary | ICD-10-CM

## 2015-12-18 LAB — URINALYSIS COMPLETE WITH MICROSCOPIC (ARMC ONLY)
BACTERIA UA: NONE SEEN
Bilirubin Urine: NEGATIVE
Glucose, UA: NEGATIVE mg/dL
LEUKOCYTES UA: NEGATIVE
NITRITE: NEGATIVE
PH: 6 (ref 5.0–8.0)
PROTEIN: 30 mg/dL — AB
SPECIFIC GRAVITY, URINE: 1.012 (ref 1.005–1.030)
SQUAMOUS EPITHELIAL / LPF: NONE SEEN

## 2015-12-18 LAB — URINE DRUG SCREEN, QUALITATIVE (ARMC ONLY)
Amphetamines, Ur Screen: NOT DETECTED
BARBITURATES, UR SCREEN: NOT DETECTED
BENZODIAZEPINE, UR SCRN: NOT DETECTED
CANNABINOID 50 NG, UR ~~LOC~~: POSITIVE — AB
Cocaine Metabolite,Ur ~~LOC~~: NOT DETECTED
MDMA (Ecstasy)Ur Screen: NOT DETECTED
Methadone Scn, Ur: NOT DETECTED
Opiate, Ur Screen: NOT DETECTED
PHENCYCLIDINE (PCP) UR S: NOT DETECTED
TRICYCLIC, UR SCREEN: NOT DETECTED

## 2015-12-18 MED ORDER — ALUM & MAG HYDROXIDE-SIMETH 200-200-20 MG/5ML PO SUSP: 30.0000 mL | ORAL | Status: DC | PRN

## 2015-12-18 MED ORDER — MAGNESIUM HYDROXIDE 400 MG/5ML PO SUSP: 30.0000 mL | Freq: Every day | ORAL | Status: DC | PRN

## 2015-12-18 MED ORDER — PROSIGHT PO TABS
1.0000 | ORAL_TABLET | Freq: Every day | ORAL | Status: DC
  Filled 2015-12-18: qty 1

## 2015-12-18 MED ORDER — LORAZEPAM 2 MG/ML IJ SOLN: 0.0000 mg | Freq: Two times a day (BID) | INTRAMUSCULAR | Status: DC

## 2015-12-18 MED ORDER — THIAMINE HCL 100 MG/ML IJ SOLN
100.0000 mg | Freq: Every day | INTRAMUSCULAR | Status: DC
  Administered 2015-12-18: 100 mg via INTRAVENOUS
  Filled 2015-12-18: qty 2

## 2015-12-18 MED ORDER — ACETAMINOPHEN 325 MG PO TABS: 650.0000 mg | ORAL_TABLET | Freq: Four times a day (QID) | ORAL | Status: DC | PRN

## 2015-12-18 MED ORDER — LORAZEPAM 2 MG PO TABS
0.0000 mg | ORAL_TABLET | Freq: Four times a day (QID) | ORAL | Status: DC
  Administered 2015-12-18 (×2): 2 mg via ORAL
  Filled 2015-12-18 (×2): qty 1

## 2015-12-18 MED ORDER — LORAZEPAM 2 MG PO TABS: 0.0000 mg | ORAL_TABLET | Freq: Two times a day (BID) | ORAL | Status: DC

## 2015-12-18 MED ORDER — LORAZEPAM 2 MG PO TABS: 2.0000 mg | ORAL_TABLET | Freq: Three times a day (TID) | ORAL | Status: DC | PRN

## 2015-12-18 MED ORDER — OCUVITE-LUTEIN PO CAPS
1.0000 | ORAL_CAPSULE | Freq: Every day | ORAL | Status: DC
  Administered 2015-12-18: 1 via ORAL
  Filled 2015-12-18: qty 1

## 2015-12-18 MED ORDER — THIAMINE HCL 100 MG/ML IJ SOLN: 100.0000 mg | Freq: Every day | INTRAMUSCULAR | Status: DC

## 2015-12-18 MED ORDER — LORAZEPAM 2 MG/ML IJ SOLN: 0.0000 mg | Freq: Four times a day (QID) | INTRAMUSCULAR | Status: DC

## 2015-12-18 MED ORDER — LORAZEPAM 2 MG PO TABS: 0.0000 mg | ORAL_TABLET | Freq: Four times a day (QID) | ORAL | Status: DC
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
## 2015-12-18 NOTE — ED Notes (Signed)
Upon rounding with the pt, pt is very diaphoretic, shaking.. Pt is sitting up in the bed eating peanut butter and crackers.. Asked the pt if he drinks ETOh on a daily basis, pt states he does not drink daily but takes other drugs off the street.. Only cannabinoid noted positive on UDS.. MD notified and CIWA initiated on pt..Logan Wilkins

## 2015-12-18 NOTE — ED Notes (Signed)
Psych MD at bedside

## 2015-12-18 NOTE — [facility] Group Notes (Signed)
[facility] Group Notes:  (Nursing/MHT/Case Management/Adjunct)  Date:  12/18/2015  Time:  6:04 PM  Type of Therapy:  Psychoeducational Skills  Participation Level:  Did Not Attend   Jessamy Torosyan Travis Odas Ozer 12/18/2015, 6:04 PM 

## 2015-12-18 NOTE — ED Notes (Signed)
Patient sleeping at this time.

## 2015-12-18 NOTE — ED Notes (Signed)
Patient sitting in bed eating a snack.

## 2015-12-18 NOTE — ED Notes (Signed)
Patient still sleeping at this time.  

## 2015-12-18 NOTE — ED Notes (Signed)
[name] TTS in with pt at present.
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
## 2015-12-18 NOTE — [facility] Group Notes (Signed)
[facility] Group Notes:  (Nursing/MHT/Case Management/Adjunct)  Date:  12/18/2015  Time:  9:29 PM  Type of Therapy:  Evening Wrap-up Group  Participation Level:  Did Not Attend  Participation Quality:  N/A  Affect:  N/A  Cognitive:  N/A  Insight:  None  Engagement in Group:  Did Not Attend  Modes of Intervention:  Activity  Summary of Progress/Problems:  Tomasita MorrowChelsea Nanta Dessie Tatem 12/18/2015, 9:29 PM

## 2015-12-18 NOTE — Tx Team (Signed)
Initial Treatment Plan 12/18/2015 2:16 PM Logan Schleinliver B Desch [id number]    PATIENT STRESSORS: Financial difficulties Marital or family conflict Substance abuse   PATIENT STRENGTHS: Capable of independent living Communication skills Physical Health   PATIENT IDENTIFIED PROBLEMS: Substance abuse     Substance abuse    Suicidal ideas              DISCHARGE CRITERIA:  Adequate post-discharge living arrangements Improved stabilization in mood, thinking, and/or behavior Motivation to continue treatment in a less acute level of care  PRELIMINARY DISCHARGE PLAN: Attend aftercare/continuing care group Return to previous living arrangement  PATIENT/FAMILY INVOLVEMENT: This treatment plan has been presented to and reviewed with the patient, Logan SchleinOliver B Wilkins, and/or family member, .  The patient and family have been given the opportunity to ask questions and make suggestions.  Ignacia FellingJennifer A Rodricus Candelaria, RN 12/18/2015, 2:16 PM

## 2015-12-18 NOTE — ED Notes (Signed)
Pt is laying in bed on back with eyes closed and appears to be asleep - resting quietly - introduced self to pt and sitter - room is secure and sitter at bedside

## 2015-12-18 NOTE — BH Assessment (Addendum)
[facility] Dr. Toni Amendlapacs.  Attending Physician will be Dr. Ardyth HarpsHernandez.   Patient [facility] room 325[facility] GustineGwen F.   Intake Paper Work has been signed and placed on patient chart.  ER staff is aware of the admission Valeda Malm(Luane, ER Sect.; Dr. Lenard LancePaduchowski, ER MD; Rosey Batheresa, Patient's Nurse & Clydie BraunKaren, Patient Access).

## 2015-12-18 NOTE — ED Notes (Signed)
Logan Bloomatricia Wilkins (spouse) [phone]

## 2015-12-18 NOTE — ED Notes (Signed)
Pt eating lunch tray at this time  

## 2015-12-18 NOTE — Progress Notes (Signed)
Patient rests in bed this afternoon. Gatorade provided. Patient vs monitored and recorded wnl. CIWA 2 for slight tremor. Patient weak and transfers from bed to wheelchair to dayroom for dinner with good appetite for french fries and soup. Patient returns to wheel chair and bed to rest with supervision. Sleeping at this time. Safety maintained.

## 2015-12-19 ENCOUNTER — Encounter: Payer: Self-pay | Admitting: Psychiatry

## 2015-12-19 DIAGNOSIS — F1220 Cannabis dependence, uncomplicated: Secondary | ICD-10-CM

## 2015-12-19 DIAGNOSIS — F32A Depression, unspecified: Secondary | ICD-10-CM

## 2015-12-19 DIAGNOSIS — F1020 Alcohol dependence, uncomplicated: Secondary | ICD-10-CM

## 2015-12-19 DIAGNOSIS — F1120 Opioid dependence, uncomplicated: Secondary | ICD-10-CM

## 2015-12-19 DIAGNOSIS — F1123 Opioid dependence with withdrawal: Secondary | ICD-10-CM

## 2015-12-19 DIAGNOSIS — F1193 Opioid use, unspecified with withdrawal: Secondary | ICD-10-CM

## 2015-12-19 DIAGNOSIS — F17200 Nicotine dependence, unspecified, uncomplicated: Secondary | ICD-10-CM

## 2015-12-19 DIAGNOSIS — F329 Major depressive disorder, single episode, unspecified: Principal | ICD-10-CM

## 2015-12-19 LAB — RAPID HIV SCREEN (HIV 1/2 AB+AG)
HIV 1/2 Antibodies: NONREACTIVE
HIV-1 P24 Antigen - HIV24: NONREACTIVE

## 2015-12-19 MED ORDER — NICOTINE 21 MG/24HR TD PT24
21.0000 mg | MEDICATED_PATCH | Freq: Every day | TRANSDERMAL | Status: DC
  Administered 2015-12-19 – 2015-12-21 (×3): 21 mg via TRANSDERMAL
  Filled 2015-12-19 (×3): qty 1

## 2015-12-19 MED ORDER — PROMETHAZINE HCL 25 MG PO TABS
25.0000 mg | ORAL_TABLET | Freq: Four times a day (QID) | ORAL | Status: DC | PRN
  Administered 2015-12-19: 25 mg via ORAL

## 2015-12-19 MED ORDER — LOPERAMIDE HCL 2 MG PO CAPS
2.0000 mg | ORAL_CAPSULE | Freq: Three times a day (TID) | ORAL | Status: DC
  Administered 2015-12-19 (×2): 2 mg via ORAL
  Filled 2015-12-19 (×2): qty 1

## 2015-12-19 MED ORDER — CHLORDIAZEPOXIDE HCL 25 MG PO CAPS: 50.0000 mg | ORAL_CAPSULE | Freq: Three times a day (TID) | ORAL | Status: DC

## 2015-12-19 MED ORDER — CLONIDINE HCL 0.1 MG PO TABS
0.1000 mg | ORAL_TABLET | Freq: Three times a day (TID) | ORAL | Status: DC
  Administered 2015-12-19 – 2015-12-20 (×3): 0.1 mg via ORAL
  Filled 2015-12-19 (×4): qty 1

## 2015-12-19 MED ORDER — ACETAMINOPHEN 500 MG PO TABS
1000.0000 mg | ORAL_TABLET | Freq: Four times a day (QID) | ORAL | Status: DC | PRN
  Administered 2015-12-19: 1000 mg via ORAL
  Filled 2015-12-19: qty 2

## 2015-12-19 MED ORDER — PROMETHAZINE HCL 25 MG PO TABS
25.0000 mg | ORAL_TABLET | Freq: Three times a day (TID) | ORAL | Status: DC
  Administered 2015-12-19 (×2): 25 mg via ORAL
  Filled 2015-12-19 (×2): qty 1

## 2015-12-19 MED ORDER — PROMETHAZINE HCL 25 MG PO TABS
ORAL_TABLET | ORAL | Status: AC
  Administered 2015-12-19: 09:00:00
  Filled 2015-12-19: qty 1

## 2015-12-19 MED ORDER — IBUPROFEN 600 MG PO TABS
600.0000 mg | ORAL_TABLET | Freq: Three times a day (TID) | ORAL | Status: AC
  Administered 2015-12-19 – 2015-12-20 (×2): 600 mg via ORAL
  Filled 2015-12-19 (×3): qty 1

## 2015-12-19 NOTE — [facility] Suicide Risk Assessment (Signed)
[facility]   Nursing information obtained from:    Demographic factors:    Current Mental Status:    Loss Factors:    Historical Factors:    Risk Reduction Factors:     Total Time spent with patient: 1 hour Principal Problem: <principal problem not specified> Diagnosis:   Patient Active Problem List   Diagnosis Date Noted  . Substance induced mood disorder ([facility]) [F19.94] 12/18/2015  . Alcohol abuse [F10.10] 12/18/2015  . Marijuana abuse [F12.10] 12/18/2015  . Suicidal ideation [R45.851] 12/18/2015   Subjective Data:   Continued Clinical Symptoms:  Alcohol Use Disorder Identification Test Final Score (AUDIT): 25 The "Alcohol Use Disorders Identification Test", Guidelines for Use in Primary Care, Second Edition.  World Science writerHealth Organization [facility](WHO). Score between 0-7:  no or low risk or alcohol related problems. Score between 8-15:  moderate risk of alcohol related problems. Score between 16-19:  high risk of alcohol related problems. Score 20 or above:  warrants further diagnostic evaluation for alcohol dependence and treatment.   CLINICAL FACTORS:   Severe Anxiety and/or Agitation Depression:   Insomnia Alcohol/Substance Abuse/Dependencies     Psychiatric Specialty Exam: Physical Exam  Constitutional: He is oriented to person, place, and time. He appears well-developed and well-nourished.  HENT:  Head: Normocephalic and atraumatic.  Eyes: EOM are normal.  Neck: Normal range of motion.  Respiratory: Effort normal.  Musculoskeletal: Normal range of motion.  Neurological: He is alert and oriented to person, place, and time.    Review of Systems  Constitutional: Positive for chills and malaise/fatigue.  HENT: Negative.   Eyes: Negative.   Respiratory: Negative.   Cardiovascular: Negative.   Gastrointestinal: Positive for abdominal pain, nausea and vomiting.  Genitourinary: Negative.   Musculoskeletal: Negative.   Skin: Negative.    Neurological: Positive for weakness.  Endo/Heme/Allergies: Negative.   Psychiatric/Behavioral: Positive for depression and substance abuse. The patient is nervous/anxious and has insomnia.     Blood pressure (S) (!) 127/92, pulse (S) 92, temperature 97.8 F (36.6 C), temperature source Oral, resp. rate 18, height 5\' 8"  (1.727 m), weight 64.4 kg (142 lb), SpO2 98 %.Body mass index is 21.59 kg/m.                                                    Sleep:         COGNITIVE FEATURES THAT CONTRIBUTE TO RISK:  None    SUICIDE RISK:   Moderate:  Frequent suicidal ideation with limited intensity, and duration, some specificity in terms of plans, no associated intent, good self-control, limited dysphoria/symptomatology, some risk factors present, and identifiable protective factors, including available and accessible social support.   [facility]  I certify that inpatient services furnished can reasonably be expected to improve the patient's condition.  Jimmy FootmanHernandez-Gonzalez,  Moody Robben, MD 12/19/2015, 3:56 PM

## 2015-12-19 NOTE — [facility] Group Notes (Signed)
[facility] LCSW Group Therapy   12/19/2015 9:30 am   Type of Therapy: Group Therapy   Participation Level: Invited but did not attend.  Participation Quality: Invited but did not attend.   Hampton AbbotKadijah Karlton Maya, MSW, LCSW-A 12/19/2015, 11:33AM

## 2015-12-19 NOTE — Progress Notes (Signed)
Patient with depressed affect, withdrawn and sleepy in bed. Patient tired and weak and remains fall risk. Transfers bed to wheelchair for breakfast in dayroom. Minimal interaction with peers. Nausea and vomiting after breakfast. CIWA 10, vs slightly elevated. MD notified. Phenergan administered. Patient returns to bed. Librium ordered for noon. Safety maintained.
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
## 2015-12-19 NOTE — Progress Notes (Signed)
D: Pt denies SI/HI/AVH. Pt is cooperative, affect is flat and sad, denies pain or discomfort. Patient's thoughts are somewhat disorganized,  he appears less anxious. Patient isolated himself to his room not interacting with peers and staff.  A: Pt was offered support and encouragement. Pt was given scheduled medications. Pt was encouraged to attend groups. Q 15 minute checks were done for safety.  R:Pt did not attend group. Pt has no complaints.Pt receptive to treatment and safety maintained on unit.
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
## 2015-12-19 NOTE — [facility] Group Notes (Signed)
[facility] Group Notes:  (Nursing/MHT/Case Management/Adjunct)  Date:  12/19/2015  Time:  4:35 PM  Type of Therapy:  Psychoeducational Skills  Participation Level:  Did Not Attend  Lynelle [facility]Cara [facility] 12/19/2015, 4:35 PM

## 2015-12-19 NOTE — Progress Notes (Signed)
Urine sample obtained with results pending.  

## 2015-12-19 NOTE — Plan of Care (Signed)
Problem: Education: Goal: Mental status will improve Outcome: Progressing Patient alert and oriented x4. Does not remember writer from admission yesterday.

## 2015-12-20 LAB — CHLAMYDIA/NGC RT PCR (ARMC ONLY)
Chlamydia Tr: NOT DETECTED
N gonorrhoeae: NOT DETECTED

## 2015-12-20 LAB — HEPATITIS C ANTIBODY

## 2015-12-20 MED ORDER — HYDROXYZINE HCL 25 MG PO TABS
25.0000 mg | ORAL_TABLET | ORAL | 0 refills | Status: DC | PRN
Start: 2015-12-20 — End: 2015-12-21

## 2015-12-20 MED ORDER — IBUPROFEN 600 MG PO TABS: 600.0000 mg | ORAL_TABLET | Freq: Four times a day (QID) | ORAL | Status: DC | PRN

## 2015-12-20 MED ORDER — CLONIDINE HCL 0.1 MG PO TABS
0.1000 mg | ORAL_TABLET | Freq: Two times a day (BID) | ORAL | Status: DC
  Administered 2015-12-20 – 2015-12-21 (×2): 0.1 mg via ORAL
  Filled 2015-12-20 (×2): qty 1

## 2015-12-20 MED ORDER — LOPERAMIDE HCL 2 MG PO CAPS: 2.0000 mg | ORAL_CAPSULE | ORAL | Status: DC | PRN

## 2015-12-20 MED ORDER — PROMETHAZINE HCL 25 MG PO TABS: 25.0000 mg | ORAL_TABLET | Freq: Three times a day (TID) | ORAL | Status: DC | PRN

## 2015-12-20 MED ORDER — HYDROCORTISONE 1 % EX CREA
TOPICAL_CREAM | Freq: Four times a day (QID) | CUTANEOUS | Status: DC
  Administered 2015-12-20 – 2015-12-21 (×3): via TOPICAL
  Filled 2015-12-20: qty 28

## 2015-12-20 MED ORDER — HYDROXYZINE HCL 25 MG PO TABS
25.0000 mg | ORAL_TABLET | ORAL | Status: DC | PRN
  Filled 2015-12-20: qty 1

## 2015-12-20 MED ORDER — HYDROCORTISONE 1 % EX CREA: TOPICAL_CREAM | Freq: Four times a day (QID) | CUTANEOUS | 0 refills | Status: DC

## 2015-12-20 NOTE — Progress Notes (Addendum)
[facility] MD Progress Note  12/20/2015 4:21 PM Logan Wilkins  MRN:  [id number] Subjective:  Pt has been eating well. He is now feeling better and has been attending groups today. No longer reporting severe symptoms of withdrawal.  Denies SI, HI or hallucinations.   SW met with pt and is planning on referring him to [place] where suboxone treatment is offered.   Principal Problem: Depression Diagnosis:   Patient Active Problem List   Diagnosis Date Noted  . unspecified depressive disorder [F32.9] 12/19/2015  . Alcohol use disorder, severe, dependence ([place]) [F10.20] 12/19/2015  . Opioid use disorder, severe, dependence ([place]) [F11.20] 12/19/2015  . Opioid withdrawal ([place]) [F11.23] 12/19/2015  . Cannabis use disorder, severe, dependence ([place]) [F12.20] 12/19/2015  . Tobacco use disorder [F17.200] 12/19/2015   Total Time spent with patient: 30 minutes  Past Psychiatric History:   Past Medical History: History reviewed. No pertinent past medical history. History reviewed. No pertinent surgical history. Family History: History reviewed. No pertinent family history. Family Psychiatric  History:  Social History:  History  Alcohol Use  . 3.6 oz/week  . 6 Cans of beer per week     History  Drug Use  . Types: Marijuana    Social History   Social History  . Marital status: Married    Spouse name: N/A  . Number of children: N/A  . Years of education: N/A   Social History Main Topics  . Smoking status: Current Every Day Smoker    Packs/day: 4.00    Types: Cigarettes  . Smokeless tobacco: Never Used  . Alcohol use 3.6 oz/week    6 Cans of beer per week  . Drug use:     Types: Marijuana  . Sexual activity: Not Asked   Other Topics Concern  . None   Social History Narrative  . None   Additional Social History:                         Sleep: Fair  Appetite:  Fair  Current Medications: Current Facility-Administered Medications  Medication Dose Route Frequency  Provider Last Rate Last Dose  . acetaminophen (TYLENOL) tablet 1,000 mg  1,000 mg Oral Q6H PRN Hildred Priest, MD   1,000 mg at 12/19/15 2138  . alum & mag hydroxide-simeth (MAALOX/MYLANTA) 200-200-20 MG/5ML suspension 30 mL  30 mL Oral Q4H PRN Gonzella Lex, MD      . cloNIDine (CATAPRES) tablet 0.1 mg  0.1 mg Oral BID Hildred Priest, MD      . hydrocortisone cream 1 %   Topical QID Hildred Priest, MD      . hydrOXYzine (ATARAX/VISTARIL) tablet 25 mg  25 mg Oral Q4H PRN Hildred Priest, MD      . ibuprofen (ADVIL,MOTRIN) tablet 600 mg  600 mg Oral Q6H PRN Hildred Priest, MD      . loperamide (IMODIUM) capsule 2 mg  2 mg Oral PRN Hildred Priest, MD      . magnesium hydroxide (MILK OF MAGNESIA) suspension 30 mL  30 mL Oral Daily PRN Gonzella Lex, MD      . nicotine (NICODERM CQ - dosed in mg/24 hours) patch 21 mg  21 mg Transdermal Daily Hildred Priest, MD   21 mg at 12/20/15 3710  . promethazine (PHENERGAN) tablet 25 mg  25 mg Oral Q8H PRN Hildred Priest, MD        Lab Results:  Results for orders placed or performed during the hospital  encounter of 12/18/15 (from the past 48 hour(s))  Rapid HIV screen (HIV 1/2 Ab+Ag)     Status: None   Collection Time: 12/19/15  5:26 PM  Result Value Ref Range   HIV-1 P24 Antigen - HIV24 NON REACTIVE NON REACTIVE   HIV 1/2 Antibodies NON REACTIVE NON REACTIVE   Interpretation (HIV Ag Ab)      A non reactive test result means that HIV 1 or HIV 2 antibodies and HIV 1 p24 antigen were not detected in the specimen.  Hepatitis C antibody     Status: None   Collection Time: 12/19/15  5:26 PM  Result Value Ref Range   HCV Ab <0.1 0.0 - 0.9 s/co ratio    Comment: (NOTE)                                  Negative:     < 0.8                             Indeterminate: 0.8 - 0.9                                  Positive:     > 0.9 The CDC recommends that a positive HCV  antibody result be followed up with a HCV Nucleic Acid Amplification test (242353). Performed At: [facility] [place], [place] [id number] Lindon Romp MD GQ:[id number]   [place] rt PCR [facility] only)     Status: None   Collection Time: 12/19/15  5:40 PM  Result Value Ref Range   Specimen source GC/Chlam URINE, RANDOM    Chlamydia Tr NOT DETECTED NOT DETECTED   N gonorrhoeae NOT DETECTED NOT DETECTED    Comment: (NOTE) 100  This methodology has not been evaluated in pregnant women or in 200  patients with a history of hysterectomy. 300 400  This methodology will not be performed on patients less than 73  years of age.     Blood Alcohol level:  Lab Results  Component Value Date   ETH 186 (H) 67/03/4579    Metabolic Disorder Labs: No results found for: HGBA1C, MPG No results found for: PROLACTIN No results found for: CHOL, TRIG, HDL, CHOLHDL, VLDL, LDLCALC  Physical Findings: AIMS:  , ,  ,  ,    CIWA:  CIWA-Ar Total: 10 COWS:     Musculoskeletal: Strength & Muscle Tone: within normal limits Gait & Station: normal Patient leans: N/A  Psychiatric Specialty Exam: Physical Exam  Constitutional: He is oriented to person, place, and time. He appears well-developed and well-nourished.  HENT:  Head: Normocephalic and atraumatic.  Eyes: EOM are normal.  Neck: Normal range of motion.  Respiratory: Effort normal.  Musculoskeletal: Normal range of motion.  Neurological: He is alert and oriented to person, place, and time.    Review of Systems  Constitutional: Negative.   HENT: Negative.   Eyes: Negative.   Respiratory: Negative.   Cardiovascular: Negative.   Gastrointestinal: Negative.   Genitourinary: Negative.   Musculoskeletal: Negative.   Skin: Negative.   Neurological: Negative.   Endo/Heme/Allergies: Negative.   Psychiatric/Behavioral: Positive for depression and substance abuse. Negative for hallucinations, memory loss and  suicidal ideas. The patient is not nervous/anxious and does not have insomnia.     Blood pressure  120/81, pulse 69, temperature 97.8 F (36.6 C), resp. rate 18, height _0  (1.727 m), weight 64.4 kg (142 lb), SpO2 98 %.Body mass index is 21.59 kg/m.  General Appearance: Disheveled  Eye Contact:  Good  Speech:  Clear and Coherent  Volume:  Normal  Mood:  Anxious  Affect:  Appropriate  Thought Process:  Linear and Descriptions of Associations: Intact  Orientation:  Full (Time, Place, and Person)  Thought Content:  Hallucinations: None  Suicidal Thoughts:  No  Homicidal Thoughts:  No  Memory:  Immediate;   Fair Recent;   Good Remote;   Good  Judgement:  Fair  Insight:  Shallow  Psychomotor Activity:  Decreased  Concentration:  Concentration: Fair and Attention Span: Fair  Recall:  Good  Fund of Knowledge:  Good  Language:  Good  Akathisia:  No  Handed:    AIMS (if indicated):     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 Clonidine will be decreased to 0.80m bid.  Alcohol withdrawal there is no evidence of alcohol withdrawal at this time vital signs have been stable.  Patient receive a few doses of Ativan in the emergency department   Tobacco use disorder: continue nicotine patch 21 mg a day  Cannabis, opiates, alcohol use disorder: patient will benefit from intensive outpatient substance abuse treatment upon discharge  Small rash on left arm: started hydrocortisone qid and  vistaril. Appears to be poison ivy   Diet regular  Precautions every 15 minute checks along with withdrawal precautions  Hospitalization and status today he has been made voluntary  Disposition patient will be discharged back to his home likely tomorrow. Pt plans to move in with his mother for now.  Discharge follow-up he will be scheduled to follow-up with [facility]  HIV and Hep C neg   HHildred Priest MD 12/20/2015, 4:21 PM

## 2015-12-20 NOTE — Progress Notes (Signed)
Recreation Therapy Notes  Date: 09.15.17 Time: 1:00 pm Location: Craft Room  Group Topic: Leisure Education  Goal Area(s) Addresses:  Patient will identify activities for each letter of the alphabet. Patient will verbalize ability to integrate positive leisure into life post d/c. Patient will verbalize ability to use leisure as a Associate Professorcoping skill.  Behavioral Response: Attentive, Interactive  Intervention: Leisure Alphabet  Activity: Patients were given a Leisure Information systems managerAlphabet worksheet and instructed to pick healthy leisure activities for each letter of the alphabet.  Education: LRT educated patients on what they need to participate in leisure activities.  Education Outcome: Acknowledges education/In group clarification offered  Clinical Observations/Feedback: Patient completed activity by writing healthy leisure activities. Patient contributed to group discussion by stating healthy leisure activities, what he needs to participate in leisure, and what makes leisure a good coping skill.  Jacquelynn CreeGreene,Shiquita Collignon M, LRT/CTRS 12/20/2015 2:01 PM

## 2015-12-20 NOTE — Progress Notes (Signed)
Patient is pleasant and cooperative.  Denies SI/HI/AVH.  Rates depression and hopelessness as a 2/10 and anxiety as a 6/10/  Verbalizes that he has some pain in his knees.  Medication and group compliant.  Support and encouragement offered.  Safety maintained.

## 2015-12-20 NOTE — Plan of Care (Signed)
Problem: Coping: Goal: Ability to verbalize frustrations and anger appropriately will improve Outcome: Progressing Discussing why he is here and what he needs to do to be discharged.

## 2015-12-20 NOTE — [facility] Suicide Risk Assessment (Signed)
[facility] INPATIENT:  Family/Significant Other Suicide Prevention Education  Suicide Prevention Education:  Education Completed; mother,  Logan Wilkins ph#: [phone] has been identified by the patient as the family member/significant other with whom the patient will be residing, and identified as the person(s) who will aid the patient in the event of a mental health crisis (suicidal ideations/suicide attempt).  With written consent from the patient, the family member/significant other has been provided the following suicide prevention education, prior to the and/or following the discharge of the patient. Patient's mother stated that she has not spoke with pt in awhile. She stated that she did not know about this hospitalization and wishes to contact patient in regards to his discharge plans. Patient is agreeable for mother to have his password to contact the unit.  The suicide prevention education provided includes the following:  Suicide risk factors  Suicide prevention and interventions  National Suicide Hotline telephone number  [facility] assessment telephone number  [facility] Emergency Assistance 911  [facility] and/or Residential Mobile Crisis Unit telephone number  Request made of family/significant other to:  Remove weapons (e.g., guns, rifles, knives), all items previously/currently identified as safety concern.    Remove drugs/medications (over-the-counter, prescriptions, illicit drugs), all items previously/currently identified as a safety concern.  The family member/significant other verbalizes understanding of the suicide prevention education information provided.  The family member/significant other agrees to remove the items of safety concern listed above.  Logan Wilkins, MSW, LCSW-A 12/20/2015, 12:19 PM

## 2015-12-20 NOTE — [facility] Group Notes (Signed)
[facility] Group Notes:  (Nursing/MHT/Case Management/Adjunct)  Date:  12/20/2015  Time:  3:07 AM  Type of Therapy:  Group Therapy  Participation Level:  Active  Participation Quality:  Appropriate  Affect:  Appropriate  Cognitive:  Appropriate  Insight:  Appropriate  Engagement in Group:  Engaged  Modes of Intervention:  n/a  Summary of Progress/Problems:  Veva Holesshley Imani Kamaree Berkel 12/20/2015, 3:07 AM

## 2015-12-20 NOTE — Tx Team (Signed)
Logan SchleinOliver B Wilkins  Pt name [id number]    Depression  Principal Problem Principal Problem:   unspecified depressive disorder Active Problems:   Alcohol use disorder, severe, dependence (HCC)   Opioid use disorder, severe, dependence (HCC)   Opioid withdrawal (HCC)   Cannabis use disorder, severe, dependence (HCC)   Tobacco use disorder  Secondary Dx/Hospital Problem List Current Facility-Administered Medications  Medication Dose Route Frequency Provider Last Rate Last Dose  . acetaminophen (TYLENOL) tablet 1,000 mg  1,000 mg Oral Q6H PRN Jimmy FootmanAndrea Hernandez-Gonzalez, MD   1,000 mg at 12/19/15 2138  . alum & mag hydroxide-simeth (MAALOX/MYLANTA) 200-200-20 MG/5ML suspension 30 mL  30 mL Oral Q4H PRN Audery AmelJohn T Clapacs, MD      . cloNIDine (CATAPRES) tablet 0.1 mg  0.1 mg Oral TID Jimmy FootmanAndrea Hernandez-Gonzalez, MD   0.1 mg at 12/20/15 0831  . ibuprofen (ADVIL,MOTRIN) tablet 600 mg  600 mg Oral TID Jimmy FootmanAndrea Hernandez-Gonzalez, MD   600 mg at 12/20/15 [id number]  . loperamide (IMODIUM) capsule 2 mg  2 mg Oral TID Jimmy FootmanAndrea Hernandez-Gonzalez, MD   2 mg at 12/19/15 1651  . magnesium hydroxide (MILK OF MAGNESIA) suspension 30 mL  30 mL Oral Daily PRN Audery AmelJohn T Clapacs, MD      . nicotine (NICODERM CQ - dosed in mg/24 hours) patch 21 mg  21 mg Transdermal Daily Jimmy FootmanAndrea Hernandez-Gonzalez, MD   21 mg at 12/20/15 [id number]  . promethazine (PHENERGAN) tablet 25 mg  25 mg Oral TID AC Jimmy FootmanAndrea Hernandez-Gonzalez, MD   25 mg at 12/19/15 1700     Current Meds Prescriptions Prior to Admission  Medication Sig Dispense Refill Last Dose  . cyclobenzaprine (FLEXERIL) 10 MG tablet Take 1 tablet (10 mg total) by mouth 2 (two) times daily as needed for muscle spasms. (Patient not taking: Reported on 12/17/2015) 20 tablet 0 Not Taking at Unknown time  . HYDROcodone-acetaminophen (NORCO/VICODIN) 5-325 MG per tablet Take 1 tablet by mouth every 6 (six) hours as needed for moderate pain. (Patient not taking: Reported on 12/17/2015) 12 tablet 0 Not  Taking at Unknown time  . naproxen (NAPROSYN) 500 MG tablet Take 1 tablet (500 mg total) by mouth 2 (two) times daily. (Patient not taking: Reported on 12/17/2015) 30 tablet 0 Not Taking at Unknown time     Prior to Admission Meds   Interdisciplinary Treatment and Diagnostic Plan Update  12/20/2015 Time of Session: 11:15 AM  Logan SchleinOliver B Wilkins MRN: [id number]  Principal Diagnosis: Depression  Secondary Diagnoses: Principal Problem:   unspecified depressive disorder Active Problems:   Alcohol use disorder, severe, dependence (HCC)   Opioid use disorder, severe, dependence (HCC)   Opioid withdrawal (HCC)   Cannabis use disorder, severe, dependence (HCC)   Tobacco use disorder   Current Medications:  Current Facility-Administered Medications  Medication Dose Route Frequency Provider Last Rate Last Dose  . acetaminophen (TYLENOL) tablet 1,000 mg  1,000 mg Oral Q6H PRN Jimmy FootmanAndrea Hernandez-Gonzalez, MD   1,000 mg at 12/19/15 2138  . alum & mag hydroxide-simeth (MAALOX/MYLANTA) 200-200-20 MG/5ML suspension 30 mL  30 mL Oral Q4H PRN Audery AmelJohn T Clapacs, MD      . cloNIDine (CATAPRES) tablet 0.1 mg  0.1 mg Oral TID Jimmy FootmanAndrea Hernandez-Gonzalez, MD   0.1 mg at 12/20/15 0831  . ibuprofen (ADVIL,MOTRIN) tablet 600 mg  600 mg Oral TID Jimmy FootmanAndrea Hernandez-Gonzalez, MD   600 mg at 12/20/15 [id number]  . loperamide (IMODIUM) capsule 2 mg  2 mg Oral TID Jimmy FootmanAndrea Hernandez-Gonzalez, MD   2 mg at  12/19/15 1651  . magnesium hydroxide (MILK OF MAGNESIA) suspension 30 mL  30 mL Oral Daily PRN Audery Amel, MD      . nicotine (NICODERM CQ - dosed in mg/24 hours) patch 21 mg  21 mg Transdermal Daily Jimmy Footman, MD   21 mg at 12/20/15 1610  . promethazine (PHENERGAN) tablet 25 mg  25 mg Oral TID AC Jimmy Footman, MD   25 mg at 12/19/15 1700    PTA Medications: Prescriptions Prior to Admission  Medication Sig Dispense Refill Last Dose  . cyclobenzaprine (FLEXERIL) 10 MG tablet Take 1 tablet (10 mg  total) by mouth 2 (two) times daily as needed for muscle spasms. (Patient not taking: Reported on 12/17/2015) 20 tablet 0 Not Taking at Unknown time  . HYDROcodone-acetaminophen (NORCO/VICODIN) 5-325 MG per tablet Take 1 tablet by mouth every 6 (six) hours as needed for moderate pain. (Patient not taking: Reported on 12/17/2015) 12 tablet 0 Not Taking at Unknown time  . naproxen (NAPROSYN) 500 MG tablet Take 1 tablet (500 mg total) by mouth 2 (two) times daily. (Patient not taking: Reported on 12/17/2015) 30 tablet 0 Not Taking at Unknown time    Treatment Modalities: Medication Management, Group therapy, Case management,  1 to 1 session with clinician, Psychoeducation, Recreational therapy.   Physician Treatment Plan for Primary Diagnosis: Depression Long Term Goal(s): Improvement in symptoms so as ready for discharge  Short Term Goals: Ability to identify changes in lifestyle to reduce recurrence of condition will improve, Ability to maintain clinical measurements within normal limits will improve and Ability to identify triggers associated with substance abuse/mental health issues will improve  Medication Management: Evaluate patient's response, side effects, and tolerance of medication regimen.  Therapeutic Interventions: 1 to 1 sessions, Unit Group sessions and Medication administration.  Evaluation of Outcomes: Progressing  Physician Treatment Plan for Secondary Diagnosis: Principal Problem:   unspecified depressive disorder Active Problems:   Alcohol use disorder, severe, dependence (HCC)   Opioid use disorder, severe, dependence (HCC)   Opioid withdrawal (HCC)   Cannabis use disorder, severe, dependence (HCC)   Tobacco use disorder   Long Term Goal(s): Improvement in symptoms so as ready for discharge  Short Term Goals: Ability to verbalize feelings will improve, Ability to demonstrate self-control will improve and Ability to identify and develop effective coping behaviors will  improve  Medication Management: Evaluate patient's response, side effects, and tolerance of medication regimen.  Therapeutic Interventions: 1 to 1 sessions, Unit Group sessions and Medication administration.  Evaluation of Outcomes: Progressing   RN Treatment Plan for Primary Diagnosis: Depression Long Term Goal(s): Knowledge of disease and therapeutic regimen to maintain health will improve  Short Term Goals: Ability to remain free from injury will improve, Ability to demonstrate self-control and Ability to identify and develop effective coping behaviors will improve  Medication Management: RN will administer medications as ordered by provider, will assess and evaluate patient's response and provide education to patient for prescribed medication. RN will report any adverse and/or side effects to prescribing provider.  Therapeutic Interventions: 1 on 1 counseling sessions, Psychoeducation, Medication administration, Evaluate responses to treatment, Monitor vital signs and CBGs as ordered, Perform/monitor CIWA, COWS, AIMS and Fall Risk screenings as ordered, Perform wound care treatments as ordered.  Evaluation of Outcomes: Progressing   LCSW Treatment Plan for Primary Diagnosis: Depression Long Term Goal(s): Safe transition to appropriate next level of care at discharge, Engage patient in therapeutic group addressing interpersonal concerns.  Short Term Goals: Engage patient  in aftercare planning with referrals and resources, Identify triggers associated with mental health/substance abuse issues and Increase skills for wellness and recovery  Therapeutic Interventions: Assess for all discharge needs, 1 to 1 time with Social worker, Explore available resources and support systems, Assess for adequacy in community support network, Educate family and significant other(s) on suicide prevention, Complete Psychosocial Assessment, Interpersonal group therapy.  Evaluation of Outcomes:  Progressing   Progress in Treatment: Attending groups: Yes Participating in groups: Yes Taking medication as prescribed: Yes, MD continues to assess for medication changes as needed Toleration medication: Yes, no side effects reported at this time Family/Significant other contact made:  Patient understands diagnosis:  Discussing patient identified problems/goals with staff: Yes Medical problems stabilized or resolved: Yes Denies suicidal/homicidal ideation:  Issues/concerns per patient self-inventory: None Other: N/A  New problem(s) identified: None identified at this time.   New Short Term/Long Term Goal(s): None identified at this time.   Discharge Plan or Barriers: see above   Reason for Continuation of Hospitalization: Anxiety Delusions  Depression Hallucinations Homicidal ideation [place] Medical Issues Medication stabilization Suicidal ideation Withdrawal symptoms  Estimated Length of Stay: 3-5 days  Attendees: Patient: Logan Wilkins 12/20/2015  11:15 AM  Physician: Radene Journey 12/20/2015  11:15 AM  Nursing: Leonia Reader 12/20/2015  11:15 AM  RN Care Manager: 12/20/2015  11:15 AM  Social Worker: Hampton Abbot, MSW, LCSW-A 12/20/2015  11:15 AM  Recreational Therapist: Hershal Coria 12/20/2015  11:15 AM  Other:  12/20/2015  11:15 AM  Other:  12/20/2015  11:15 AM  Other: 12/20/2015  11:15 AM    Scribe for Treatment Team: Hampton Abbot, MSW, LCSW-A

## 2015-12-20 NOTE — Progress Notes (Signed)
  [facility] Adult Case Management Discharge Plan :  Will you be returning to the same living situation after discharge:  Yes,  home with family. [facility] Yes,  mother will pick pt up. Do you have the ability to pay for your medications: No. referred to medication management clinic.   Release of information consent forms completed and in the chart;  Patient's signature needed at discharge.  Patient to Follow up at: Follow-up Information    [facility]. Go on 12/23/2015.   Why:  Please arrive to your hospital follow-[facility] at 8:30 am for prompt services. It is important that you bring your discharge packet to this appointment for medication management and therapy. Contact information: Address: [address], [place], [place] [address] Phone: [phone] Fax: [phone]          Next level of care provider has access to [facility] Link:no  Safety Planning and Suicide Prevention discussed: Yes,  SPE reveiwed with patient and mother  Have you used any form of tobacco in the last 30 days? (Cigarettes, Smokeless Tobacco, Cigars, and/or Pipes): Yes  Has patient been referred to the Quitline?: Patient refused referral  Patient has been referred for addiction treatment: Yes - [facility]  Lynden OxfordKadijah R Brelyn Woehl, MSW, LCSW-A 12/20/2015, 3:03 PM

## 2015-12-20 NOTE — [facility] Group Notes (Signed)
[facility] LCSW Group Therapy   12/20/2015 9:30 AM   Type of Therapy: Group Therapy   Participation Level: Active   Participation Quality: Attentive, Sharing and Supportive   Affect: Appropriate   Cognitive: Alert and Oriented   Insight: Developing/Improving and Engaged   Engagement in Therapy: Developing/Improving and Engaged   Modes of Intervention: Clarification, Confrontation, Discussion, Education, Exploration, Limit-setting, Orientation, Problem-solving, Rapport Building, Dance movement psychotherapisteality Testing, Socialization and Support   Summary of Progress/Problems: The topic for today was feelings about relapse. Pt discussed what relapse prevention is to them and identified triggers that they are on the path to relapse. Pt processed their feeling towards relapse and was able to relate to peers. Pt discussed coping skills that can be used for relapse prevention.  Patient defined relapse as "failing to improve." Pt stated that his triggers for relapse are not having enough money and family disputes. Pt identified outpatient treatment and reconnecting with family (mother) as ways to reduce relapse and continue his journey in recovery.     Hampton AbbotKadijah Kenleigh Toback, MSW, Theresia MajorsLCSWA

## 2015-12-20 NOTE — Progress Notes (Signed)
D: Pt denies SI/HI/AVH. Pt is pleasant and cooperative, affect is flat but brightens upon approach. Patient's is less disorganized. Pt is scheduled for ECT tomorrow, patient is on NPO status after midnight,  he appears less anxious and he is interacting with peers and staff appropriately.  A: Pt was offered support and encouragement. Pt was given scheduled medications. Pt was encouraged to attend groups. Q 15 minute checks were done for safety.  R:Pt attends groups and interacts well with peers and staff. Pt is taking medication. Pt has no complaints.Pt receptive to treatment and safety maintained on unit.

## 2015-12-20 NOTE — Plan of Care (Signed)
Problem: Coping: Goal: Ability to verbalize frustrations and anger appropriately will improve Outcome: Progressing Patient verbalized feelings to staff.    

## 2015-12-21 MED ORDER — HYDROCORTISONE 1 % EX CREA: TOPICAL_CREAM | Freq: Four times a day (QID) | CUTANEOUS | 0 refills | Status: AC

## 2015-12-21 MED ORDER — HYDROXYZINE HCL 25 MG PO TABS: 25.0000 mg | ORAL_TABLET | ORAL | 0 refills | Status: AC | PRN

## 2015-12-21 NOTE — Progress Notes (Signed)
D: Pt denies SI/HI/AVH. Pt is pleasant and cooperative, affect is bright. Pt stated he feels better from resting, he appears less anxious and he is interacting with peers and staff appropriately.  A: Pt was offered support and encouragement. Pt was given scheduled medications. Pt was encouraged to attend groups. Q 15 minute checks were done for safety.  R:Pt attends groups and interacts well with peers and staff. Pt is taking medication. Pt has no complaints.Pt receptive to treatment and safety maintained on unit.

## 2015-12-21 NOTE — Plan of Care (Signed)
Problem: Coping: Goal: Ability to verbalize frustrations and anger appropriately will improve Outcome: Progressing Patient verbalized feelings to staff.    

## 2015-12-21 NOTE — Progress Notes (Signed)
Pleasant and cooperative.  Denies SI/HI/AVH.  Verbalizes that he is ready to go home.   Discharge instructions given, verbalized understanding.  Prescriptions given and personal belongings returned.  Escorted off unit by this Clinical research associatewriter to meet mother to travel home.

## 2015-12-21 NOTE — Discharge Summary (Signed)
Physician Discharge Summary Note  Patient:  Logan Wilkins is an 30 y.o., male MRN:  [id number] DOB:  [birth year] Patient phone:  [phone] (home)  Patient address:   [address] [place] [address],  Total Time spent with patient: 30 minutes  Date of Admission:  12/18/2015 Date of Discharge: Saturday, 12/21/2015  Reason for Admission:  30 year old man with opiate dependence admitted through the emergency room with depressed mood confusion and agitation related to opiate abuse and alcohol abuse. Poor self-care. Highly distressed. Seemed to be unable to cope outside the hospital.  Principal Problem: Depression Discharge Diagnoses: Patient Active Problem List   Diagnosis Date Noted  . unspecified depressive disorder [F32.9] 12/19/2015  . Alcohol use disorder, severe, dependence (HCC) [F10.20] 12/19/2015  . Opioid use disorder, severe, dependence (HCC) [F11.20] 12/19/2015  . Opioid withdrawal (HCC) [F11.23] 12/19/2015  . Cannabis use disorder, severe, dependence (HCC) [F12.20] 12/19/2015  . Tobacco use disorder [F17.200] 12/19/2015    Past Psychiatric History: Patient has a history of substance abuse. No history of suicide attempts or violence. No other past clear psychiatric history.  Past Medical History: History reviewed. No pertinent past medical history. History reviewed. No pertinent surgical history. Family History: History reviewed. No pertinent family history. Family Psychiatric  History: Positive for substance abuse Social History:  History  Alcohol Use  . 3.6 oz/week  . 6 Cans of beer per week     History  Drug Use  . Types: Marijuana    Social History   Social History  . Marital status: Married    Spouse name: N/A  . Number of children: N/A  . Years of education: N/A   Social History Main Topics  . Smoking status: Current Every Day Smoker    Packs/day: 4.00    Types: Cigarettes  . Smokeless tobacco: Never Used  . Alcohol use 3.6 oz/week     6 Cans of beer per week  . Drug use:     Types: Marijuana  . Sexual activity: Not Asked   Other Topics Concern  . None   Social History Narrative  . None    Hospital Course:  Patient was treated for opiate withdrawal and detoxed without complication. Engaged in groups and individual therapy appropriately. Did not engage in any dangerous or suicidal behavior. Patient consistently denies suicidal ideation. He appropriately engaged and plans to start going to [place] and expresses a desire to start Suboxone treatment. Mood improved. Initially he was unsteady on his feet but his physical condition has come back to a normal baseline.  Physical Findings: AIMS:  , ,  ,  ,    CIWA:  CIWA-Ar Total: 10 COWS:     Musculoskeletal: Strength & Muscle Tone: within normal limits Gait & Station: normal Patient leans: N/A  Psychiatric Specialty Exam: Physical Exam  Nursing note and vitals reviewed. Constitutional: He appears well-developed and well-nourished.  HENT:  Head: Normocephalic and atraumatic.  Eyes: Conjunctivae are normal. Pupils are equal, round, and reactive to light.  Neck: Normal range of motion.  Cardiovascular: Regular rhythm and normal heart sounds.   Respiratory: Effort normal. No respiratory distress.  GI: Soft.  Musculoskeletal: Normal range of motion.  Neurological: He is alert.  Skin: Skin is warm and dry.  Psychiatric: He has a normal mood and affect. His behavior is normal. Judgment and thought content normal.    Review of Systems  Constitutional: Negative.   HENT: Negative.   Eyes: Negative.   Respiratory: Negative.  Cardiovascular: Negative.   Gastrointestinal: Negative.   Musculoskeletal: Negative.   Skin: Negative.   Neurological: Negative.   Psychiatric/Behavioral: Negative for depression, hallucinations, memory loss, substance abuse and suicidal ideas. The patient is not nervous/anxious and does not have insomnia.     Blood pressure 122/90, pulse 82,  temperature 98.8 F (37.1 C), temperature source Oral, resp. rate 18, height 5\' 8"  (1.727 m), weight 64.4 kg (142 lb), SpO2 98 %.Body mass index is 21.59 kg/m.  General Appearance: Disheveled  Eye Contact:  Good  Speech:  Clear and Coherent  Volume:  Normal  Mood:  Euthymic  Affect:  Appropriate  Thought Process:  Goal Directed  Orientation:  Full (Time, Place, and Person)  Thought Content:  Logical  Suicidal Thoughts:  No  Homicidal Thoughts:  No  Memory:  Immediate;   Good Recent;   Fair Remote;   Fair  Judgement:  Fair  Insight:  Fair  Psychomotor Activity:  Normal  Concentration:  Concentration: Fair  Recall:  FiservFair  Fund of Knowledge:  Fair  Language:  Fair  Akathisia:  No  Handed:  Right  AIMS (if indicated):     Assets:  Communication Skills Desire for Improvement Housing Physical Health Resilience  ADL's:  Intact  Cognition:  WNL  Sleep:  Number of Hours: 5.15     Have you used any form of tobacco in the last 30 days? (Cigarettes, Smokeless Tobacco, Cigars, and/or Pipes): Yes  Has this patient used any form of tobacco in the last 30 days? (Cigarettes, Smokeless Tobacco, Cigars, and/or Pipes) Yes, No  Blood Alcohol level:  Lab Results  Component Value Date   ETH 186 (H) 12/17/2015    Metabolic Disorder Labs:  No results found for: HGBA1C, MPG No results found for: PROLACTIN No results found for: CHOL, TRIG, HDL, CHOLHDL, VLDL, LDLCALC  See Psychiatric Specialty Exam and Suicide Risk Assessment completed by Attending Physician prior to discharge.  Discharge destination:  Home  Is patient on multiple antipsychotic therapies at discharge:  No   Has Patient had three or more failed trials of antipsychotic monotherapy by history:  No  Recommended Plan for Multiple Antipsychotic Therapies: NA     Medication List    STOP taking these medications   cyclobenzaprine 10 MG tablet Commonly known as:  FLEXERIL   HYDROcodone-acetaminophen 5-325 MG  tablet Commonly known as:  NORCO/VICODIN   naproxen 500 MG tablet Commonly known as:  NAPROSYN     TAKE these medications     Indication  hydrocortisone cream 1 % Apply topically 4 (four) times daily.  Indication:  Skin Disease Successfully Treated with Steroid Therapy   hydrOXYzine 25 MG tablet Commonly known as:  ATARAX/VISTARIL Take 1 tablet (25 mg total) by mouth every 4 (four) hours as needed for itching.  Indication:  Itching with Contact Dermatitis      Follow-up Information    [facility]. Go on 12/23/2015.   Why:  Please arrive to your hospital follow-[facility] at 8:30 am for prompt services. It is important that you bring your discharge packet to this appointment for medication management and therapy. Contact information: Address: [address], [place], [place] [address] Phone: [phone] Fax: [phone]          Follow-up recommendations:  Activity:  Activity as tolerated Diet:  Normal diet Other:  Follow up immediately with outpatient substance abuse treatment  Comments:  Patient will be discharged today as planned by treatment team.  Mental status appears to be very stable no indication of acute dangerousness. Reviewed plan for outpatient treatment with the patient.  Signed: Mordecai Rasmussen, MD 12/21/2015, 11:07 AM

## 2015-12-21 NOTE — [facility] Group Notes (Signed)
[facility] Group Notes:  (Nursing/MHT/Case Management/Adjunct)  Date:  12/21/2015  Time:  3:03 AM  Type of Therapy:  Psychoeducational Skills  Participation Level:  Active  Participation Quality:  Appropriate, Attentive and Sharing  Affect:  Appropriate  Cognitive:  Appropriate  Insight:  Appropriate, Good and Improving  Engagement in Group:  Engaged  Modes of Intervention:  Discussion, Socialization and Support  Summary of Progress/Problems:  Chancy MilroyLaquanda Y Allyiah Gartner 12/21/2015, 3:03 AM

## 2015-12-21 NOTE — Tx Team (Signed)
Interdisciplinary Treatment and Diagnostic Plan Update  12/21/2015 Time of Session: 9:15am Logan Wilkins  Principal Diagnosis: Depression  Secondary Diagnoses: Principal Problem:   unspecified depressive disorder Active Problems:   Alcohol use disorder, severe, dependence (HCC)   Opioid use disorder, severe, dependence (HCC)   Opioid withdrawal (HCC)   Cannabis use disorder, severe, dependence (HCC)   Tobacco use disorder   Current Medications:  Current Facility-Administered Medications  Medication Dose Route Frequency Provider Last Rate Last Dose  . acetaminophen (TYLENOL) tablet 1,000 mg  1,000 mg Oral Q6H PRN Jimmy FootmanAndrea Hernandez-Gonzalez, MD   1,000 mg at 12/19/15 2138  . alum & mag hydroxide-simeth (MAALOX/MYLANTA) 200-200-20 MG/5ML suspension 30 mL  30 mL Oral Q4H PRN Audery AmelJohn T Clapacs, MD      . cloNIDine (CATAPRES) tablet 0.1 mg  0.1 mg Oral BID Jimmy FootmanAndrea Hernandez-Gonzalez, MD   0.1 mg at 12/21/15 0820  . hydrocortisone cream 1 %   Topical QID Jimmy FootmanAndrea Hernandez-Gonzalez, MD      . hydrOXYzine (ATARAX/VISTARIL) tablet 25 mg  25 mg Oral Q4H PRN Jimmy FootmanAndrea Hernandez-Gonzalez, MD      . ibuprofen (ADVIL,MOTRIN) tablet 600 mg  600 mg Oral Q6H PRN Jimmy FootmanAndrea Hernandez-Gonzalez, MD      . loperamide (IMODIUM) capsule 2 mg  2 mg Oral PRN Jimmy FootmanAndrea Hernandez-Gonzalez, MD      . magnesium hydroxide (MILK OF MAGNESIA) suspension 30 mL  30 mL Oral Daily PRN Audery AmelJohn T Clapacs, MD      . nicotine (NICODERM CQ - dosed in mg/24 hours) patch 21 mg  21 mg Transdermal Daily Jimmy FootmanAndrea Hernandez-Gonzalez, MD   21 mg at 12/21/15 [id number]  . promethazine (PHENERGAN) tablet 25 mg  25 mg Oral Q8H PRN Jimmy FootmanAndrea Hernandez-Gonzalez, MD       PTA Medications: Prescriptions Prior to Admission  Medication Sig Dispense Refill Last Dose  . cyclobenzaprine (FLEXERIL) 10 MG tablet Take 1 tablet (10 mg total) by mouth 2 (two) times daily as needed for muscle spasms. (Patient not taking: Reported on 12/17/2015) 20 tablet 0 Not  Taking at Unknown time  . HYDROcodone-acetaminophen (NORCO/VICODIN) 5-325 MG per tablet Take 1 tablet by mouth every 6 (six) hours as needed for moderate pain. (Patient not taking: Reported on 12/17/2015) 12 tablet 0 Not Taking at Unknown time  . naproxen (NAPROSYN) 500 MG tablet Take 1 tablet (500 mg total) by mouth 2 (two) times daily. (Patient not taking: Reported on 12/17/2015) 30 tablet 0 Not Taking at Unknown time    Treatment Modalities: Medication Management, Group therapy, Case management,  1 to 1 session with clinician, Psychoeducation, Recreational therapy.   Physician Treatment Plan for Primary Diagnosis: Depression Long Term Goal(s): Improvement in symptoms so as ready for discharge   Short Term Goals: Ability to identify changes in lifestyle to reduce recurrence of condition will improve, Ability to maintain clinical measurements within normal limits will improve and Ability to identify triggers associated with substance abuse/mental health issues will improve  Medication Management: Evaluate patient's response, side effects, and tolerance of medication regimen.  Therapeutic Interventions: 1 to 1 sessions, Unit Group sessions and Medication administration.  Evaluation of Outcomes: Adequate for Discharge  Physician Treatment Plan for Secondary Diagnosis: Principal Problem:   unspecified depressive disorder Active Problems:   Alcohol use disorder, severe, dependence (HCC)   Opioid use disorder, severe, dependence (HCC)   Opioid withdrawal (HCC)   Cannabis use disorder, severe, dependence (HCC)   Tobacco use disorder  Long Term Goal(s): Improvement in symptoms so  as ready for discharge  Short Term Goals: Ability to verbalize feelings will improve, Ability to demonstrate self-control will improve and Ability to identify and develop effective coping behaviors will improve  Medication Management: Evaluate patient's response, side effects, and tolerance of medication  regimen.  Therapeutic Interventions: 1 to 1 sessions, Unit Group sessions and Medication administration.  Evaluation of Outcomes: Adequate for Discharge   RN Treatment Plan for Primary Diagnosis: Depression Long Term Goal(s): Knowledge of disease and therapeutic regimen to maintain health will improve  Short Term Goals: Ability to remain free from injury will improve, Ability to demonstrate self-control and Ability to identify and develop effective coping behaviors will improve  Medication Management: RN will administer medications as ordered by provider, will assess and evaluate patient's response and provide education to patient for prescribed medication. RN will report any adverse and/or side effects to prescribing provider.  Therapeutic Interventions: 1 on 1 counseling sessions, Psychoeducation, Medication administration, Evaluate responses to treatment, Monitor vital signs and CBGs as ordered, Perform/monitor CIWA, COWS, AIMS and Fall Risk screenings as ordered, Perform wound care treatments as ordered.  Evaluation of Outcomes: Adequate for Discharge   LCSW Treatment Plan for Primary Diagnosis: Depression Long Term Goal(s): Safe transition to appropriate next level of care at discharge, Engage patient in therapeutic group addressing interpersonal concerns.  Short Term Goals: Engage patient in aftercare planning with referrals and resources, Identify triggers associated with mental health/substance abuse issues and Increase skills for wellness and recovery  Therapeutic Interventions: Assess for all discharge needs, 1 to 1 time with Social worker, Explore available resources and support systems, Assess for adequacy in community support network, Educate family and significant other(s) on suicide prevention, Complete Psychosocial Assessment, Interpersonal group therapy.  Evaluation of Outcomes: Adequate for Discharge   Progress in Treatment: Attending groups: Yes. Participating in  groups: Yes. Taking medication as prescribed: Yes. Toleration medication: Yes. Family/Significant other contact made: Yes, individual(s) contacted:  mother Patient understands diagnosis: Yes. Discussing patient identified problems/goals with staff: Yes. Medical problems stabilized or resolved: Yes. Denies suicidal/homicidal ideation: Yes. Issues/concerns per patient self-inventory: No. Other: n/a  New problem(s) identified: None identified at this time.   New Short Term/Long Term Goal(s): None identified at this time  Discharge Plan or Barriers: Patient will be discharging home with mother and has follow-up with [facility].  Reason for Continuation of Hospitalization: Anticipated discharge 12/21/15  Estimated Length of Stay: Anticipated discharge 12/21/15  Attendees: Patient: Logan Wilkins 12/21/2015 9:20 AM  Physician: Mordecai Rasmussen, MD 12/21/2015 9:20 AM  Nursing: Leonia Reader, RN 12/21/2015 9:20 AM  RN Care Manager: 12/21/2015 9:20 AM  Social Worker: Fredrich Birks. Garnette Czech MSW, LCSWA 12/21/2015 9:20 AM  Recreational Therapist:  12/21/2015 9:20 AM  Other:  12/21/2015 9:20 AM  Other:  12/21/2015 9:20 AM  Other: 12/21/2015 9:20 AM    Scribe for Treatment Team: Fredrich Birks. Garnette Czech MSW, [facility] 12/21/2015 9:25 AM
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
## 2016-02-26 ENCOUNTER — Emergency Department (HOSPITAL_COMMUNITY): Payer: No Typology Code available for payment source

## 2016-02-26 ENCOUNTER — Emergency Department (HOSPITAL_COMMUNITY): Payer: No Typology Code available for payment source | Admitting: Anesthesiology

## 2016-02-26 ENCOUNTER — Encounter (HOSPITAL_COMMUNITY)
Admission: EM | Disposition: A | Discharge status: Home / Self Care | Payer: Self-pay | Source: Home / Self Care | Attending: Emergency Medicine

## 2016-02-26 ENCOUNTER — Encounter (HOSPITAL_COMMUNITY): Payer: Self-pay | Admitting: Radiology

## 2016-02-26 ENCOUNTER — Ambulatory Visit (HOSPITAL_COMMUNITY)
Admission: EM | Admit: 2016-02-26 | Discharge: 2016-02-26 | Disposition: A | Discharge status: Home / Self Care | Payer: No Typology Code available for payment source | Attending: Emergency Medicine | Admitting: Emergency Medicine

## 2016-02-26 DIAGNOSIS — Z882 Allergy status to sulfonamides status: Secondary | ICD-10-CM | POA: Diagnosis not present

## 2016-02-26 DIAGNOSIS — Y9389 Activity, other specified: Secondary | ICD-10-CM | POA: Diagnosis not present

## 2016-02-26 DIAGNOSIS — S01112A Laceration without foreign body of left eyelid and periocular area, initial encounter: Secondary | ICD-10-CM | POA: Diagnosis not present

## 2016-02-26 DIAGNOSIS — R531 Weakness: Secondary | ICD-10-CM | POA: Insufficient documentation

## 2016-02-26 DIAGNOSIS — F10920 Alcohol use, unspecified with intoxication, uncomplicated: Secondary | ICD-10-CM

## 2016-02-26 DIAGNOSIS — T1490XA Injury, unspecified, initial encounter: Secondary | ICD-10-CM

## 2016-02-26 DIAGNOSIS — Y92410 Unspecified street and highway as the place of occurrence of the external cause: Secondary | ICD-10-CM | POA: Diagnosis not present

## 2016-02-26 DIAGNOSIS — S0122XA Laceration with foreign body of nose, initial encounter: Secondary | ICD-10-CM | POA: Insufficient documentation

## 2016-02-26 DIAGNOSIS — S01111A Laceration without foreign body of right eyelid and periocular area, initial encounter: Secondary | ICD-10-CM | POA: Diagnosis not present

## 2016-02-26 DIAGNOSIS — F17210 Nicotine dependence, cigarettes, uncomplicated: Secondary | ICD-10-CM | POA: Insufficient documentation

## 2016-02-26 DIAGNOSIS — Y998 Other external cause status: Secondary | ICD-10-CM | POA: Diagnosis not present

## 2016-02-26 DIAGNOSIS — F10129 Alcohol abuse with intoxication, unspecified: Secondary | ICD-10-CM | POA: Diagnosis not present

## 2016-02-26 DIAGNOSIS — S0181XA Laceration without foreign body of other part of head, initial encounter: Secondary | ICD-10-CM | POA: Diagnosis present

## 2016-02-26 DIAGNOSIS — F1110 Opioid abuse, uncomplicated: Secondary | ICD-10-CM | POA: Diagnosis not present

## 2016-02-26 HISTORY — PX: FACIAL LACERATION REPAIR: SHX6589

## 2016-02-26 HISTORY — DX: Opioid abuse, uncomplicated: F11.10

## 2016-02-26 LAB — COMPREHENSIVE METABOLIC PANEL
ALT: 35 U/L (ref 17–63)
AST: 44 U/L — AB (ref 15–41)
Albumin: 4.6 g/dL (ref 3.5–5.0)
Alkaline Phosphatase: 79 U/L (ref 38–126)
Anion gap: 12 (ref 5–15)
BUN: <5 mg/dL — ABNORMAL LOW (ref 6–20)
CHLORIDE: 109 mmol/L (ref 101–111)
CO2: 20 mmol/L — ABNORMAL LOW (ref 22–32)
Calcium: 9.4 mg/dL (ref 8.9–10.3)
Creatinine, Ser: 0.83 mg/dL (ref 0.61–1.24)
Glucose, Bld: 99 mg/dL (ref 65–99)
POTASSIUM: 3.5 mmol/L (ref 3.5–5.1)
Sodium: 141 mmol/L (ref 135–145)
Total Bilirubin: 0.4 mg/dL (ref 0.3–1.2)
Total Protein: 7.7 g/dL (ref 6.5–8.1)

## 2016-02-26 LAB — I-STAT CHEM 8, ED
BUN: 4 mg/dL — ABNORMAL LOW (ref 6–20)
CREATININE: 1.1 mg/dL (ref 0.61–1.24)
Calcium, Ion: 1.07 mmol/L — ABNORMAL LOW (ref 1.15–1.40)
Chloride: 109 mmol/L (ref 101–111)
GLUCOSE: 107 mg/dL — AB (ref 65–99)
HEMATOCRIT: 53 % — AB (ref 39.0–52.0)
HEMOGLOBIN: 18 g/dL — AB (ref 13.0–17.0)
Potassium: 3.5 mmol/L (ref 3.5–5.1)
Sodium: 144 mmol/L (ref 135–145)
TCO2: 20 mmol/L (ref 0–100)

## 2016-02-26 LAB — PROTIME-INR
INR: 1.04
PROTHROMBIN TIME: 13.7 s (ref 11.4–15.2)

## 2016-02-26 LAB — CBC
HEMATOCRIT: 49.5 % (ref 39.0–52.0)
Hemoglobin: 17.7 g/dL — ABNORMAL HIGH (ref 13.0–17.0)
MCH: 33.3 pg (ref 26.0–34.0)
MCHC: 35.8 g/dL (ref 30.0–36.0)
MCV: 93 fL (ref 78.0–100.0)
Platelets: 262 10*3/uL (ref 150–400)
RBC: 5.32 MIL/uL (ref 4.22–5.81)
RDW: 13.2 % (ref 11.5–15.5)
WBC: 8.3 10*3/uL (ref 4.0–10.5)

## 2016-02-26 LAB — ETHANOL: Alcohol, Ethyl (B): 282 mg/dL — ABNORMAL HIGH (ref <5)

## 2016-02-26 LAB — I-STAT CG4 LACTIC ACID, ED: Lactic Acid, Venous: 3.08 mmol/L (ref 0.5–1.9)

## 2016-02-26 LAB — CDS SEROLOGY

## 2016-02-26 SURGERY — REPAIR, LACERATION, FACE
Anesthesia: General | Site: Face

## 2016-02-26 MED ORDER — LACTATED RINGERS IV SOLN
INTRAVENOUS | Status: DC | PRN
  Administered 2016-02-26 (×2): via INTRAVENOUS

## 2016-02-26 MED ORDER — EPINEPHRINE PF 1 MG/ML IJ SOLN
INTRAMUSCULAR | Status: AC
  Filled 2016-02-26: qty 1

## 2016-02-26 MED ORDER — FENTANYL CITRATE (PF) 100 MCG/2ML IJ SOLN
INTRAMUSCULAR | Status: AC | PRN
  Administered 2016-02-26: 100 ug via INTRAVENOUS

## 2016-02-26 MED ORDER — FENTANYL CITRATE (PF) 100 MCG/2ML IJ SOLN
INTRAMUSCULAR | Status: AC
Start: 2016-02-26 — End: 2016-02-26
  Filled 2016-02-26: qty 2

## 2016-02-26 MED ORDER — CEFAZOLIN SODIUM-DEXTROSE 2-4 GM/100ML-% IV SOLN
2.0000 g | Freq: Once | INTRAVENOUS | Status: AC
Start: 2016-02-26 — End: 2016-02-26
  Administered 2016-02-26: 2 g via INTRAVENOUS
  Filled 2016-02-26: qty 100

## 2016-02-26 MED ORDER — LIDOCAINE HCL (CARDIAC) 20 MG/ML IV SOLN
INTRAVENOUS | Status: DC | PRN
  Administered 2016-02-26: 100 mg via INTRAVENOUS

## 2016-02-26 MED ORDER — SUCCINYLCHOLINE CHLORIDE 20 MG/ML IJ SOLN
INTRAMUSCULAR | Status: DC | PRN
  Administered 2016-02-26: 100 mg via INTRAVENOUS

## 2016-02-26 MED ORDER — SUGAMMADEX SODIUM 200 MG/2ML IV SOLN
INTRAVENOUS | Status: AC
  Filled 2016-02-26: qty 2

## 2016-02-26 MED ORDER — MIDAZOLAM HCL 2 MG/2ML IJ SOLN
INTRAMUSCULAR | Status: AC
  Filled 2016-02-26: qty 2

## 2016-02-26 MED ORDER — SODIUM CHLORIDE 0.9 % IV SOLN
INTRAVENOUS | Status: AC | PRN
  Administered 2016-02-26: 1000 mL via INTRAVENOUS

## 2016-02-26 MED ORDER — IOPAMIDOL (ISOVUE-300) INJECTION 61%
INTRAVENOUS | Status: AC
  Administered 2016-02-26: 100 mL
  Filled 2016-02-26: qty 100

## 2016-02-26 MED ORDER — BACITRACIN ZINC 500 UNIT/GM EX OINT
TOPICAL_OINTMENT | CUTANEOUS | Status: AC
  Filled 2016-02-26: qty 28.35

## 2016-02-26 MED ORDER — MEPERIDINE HCL 25 MG/ML IJ SOLN: 6.2500 mg | INTRAMUSCULAR | Status: DC | PRN

## 2016-02-26 MED ORDER — 0.9 % SODIUM CHLORIDE (POUR BTL) OPTIME
TOPICAL | Status: DC | PRN
  Administered 2016-02-26: 1000 mL

## 2016-02-26 MED ORDER — PROPOFOL 10 MG/ML IV BOLUS
INTRAVENOUS | Status: DC | PRN
  Administered 2016-02-26: 200 mg via INTRAVENOUS

## 2016-02-26 MED ORDER — SODIUM CHLORIDE 0.9 % IV BOLUS (SEPSIS): 1000.0000 mL | Freq: Once | INTRAVENOUS | Status: DC

## 2016-02-26 MED ORDER — FENTANYL CITRATE (PF) 100 MCG/2ML IJ SOLN
INTRAMUSCULAR | Status: DC | PRN
  Administered 2016-02-26 (×2): 100 ug via INTRAVENOUS

## 2016-02-26 MED ORDER — SUGAMMADEX SODIUM 200 MG/2ML IV SOLN
INTRAVENOUS | Status: DC | PRN
  Administered 2016-02-26: 100 mg via INTRAVENOUS

## 2016-02-26 MED ORDER — PROMETHAZINE HCL 25 MG/ML IJ SOLN: 6.2500 mg | INTRAMUSCULAR | Status: DC | PRN

## 2016-02-26 MED ORDER — HYDROMORPHONE HCL 1 MG/ML IJ SOLN: 0.2500 mg | INTRAMUSCULAR | Status: DC | PRN

## 2016-02-26 MED ORDER — ONDANSETRON HCL 4 MG/2ML IJ SOLN
INTRAMUSCULAR | Status: DC | PRN
  Administered 2016-02-26: 4 mg via INTRAVENOUS

## 2016-02-26 MED ORDER — ROCURONIUM BROMIDE 100 MG/10ML IV SOLN
INTRAVENOUS | Status: DC | PRN
  Administered 2016-02-26: 30 mg via INTRAVENOUS

## 2016-02-26 MED ORDER — LIDOCAINE HCL (PF) 1 % IJ SOLN
INTRAMUSCULAR | Status: AC
  Filled 2016-02-26: qty 30

## 2016-02-26 MED ORDER — AMOXICILLIN-POT CLAVULANATE 500-125 MG PO TABS: 1.0000 | ORAL_TABLET | Freq: Two times a day (BID) | ORAL | 0 refills | Status: AC

## 2016-02-26 MED ORDER — FENTANYL CITRATE (PF) 100 MCG/2ML IJ SOLN: 100.0000 ug | Freq: Once | INTRAMUSCULAR | Status: DC

## 2016-02-26 MED ORDER — PHENYLEPHRINE HCL 10 MG/ML IJ SOLN
INTRAMUSCULAR | Status: DC | PRN
  Administered 2016-02-26 (×2): 40 ug via INTRAVENOUS

## 2016-02-26 MED ORDER — PROPOFOL 10 MG/ML IV BOLUS
INTRAVENOUS | Status: AC
  Filled 2016-02-26: qty 20

## 2016-02-26 SURGICAL SUPPLY — 39 items
BLADE SURG 15 STRL LF DISP TIS (BLADE) IMPLANT
BLADE SURG 15 STRL SS (BLADE)
CANISTER SUCTION 2500CC (MISCELLANEOUS) ×3 IMPLANT
CLEANER TIP ELECTROSURG 2X2 (MISCELLANEOUS) ×3 IMPLANT
CORDS BIPOLAR (ELECTRODE) ×3 IMPLANT
COVER SURGICAL LIGHT HANDLE (MISCELLANEOUS) ×3 IMPLANT
DRAIN CHANNEL 7F FF FLAT (WOUND CARE) IMPLANT
DRAPE PROXIMA HALF (DRAPES) IMPLANT
ELECT COATED BLADE 2.86 ST (ELECTRODE) ×3 IMPLANT
ELECT REM PT RETURN 9FT ADLT (ELECTROSURGICAL) ×3
ELECTRODE REM PT RTRN 9FT ADLT (ELECTROSURGICAL) ×1 IMPLANT
EVACUATOR SILICONE 100CC (DRAIN) IMPLANT
GAUZE SPONGE 4X4 12PLY STRL (GAUZE/BANDAGES/DRESSINGS) ×9 IMPLANT
GAUZE SPONGE 4X4 16PLY XRAY LF (GAUZE/BANDAGES/DRESSINGS) ×6 IMPLANT
GLOVE BIOGEL M 7.0 STRL (GLOVE) ×6 IMPLANT
GOWN STRL REUS W/ TWL LRG LVL3 (GOWN DISPOSABLE) ×3 IMPLANT
GOWN STRL REUS W/TWL LRG LVL3 (GOWN DISPOSABLE) ×6
KIT BASIN OR (CUSTOM PROCEDURE TRAY) ×3 IMPLANT
KIT ROOM TURNOVER OR (KITS) ×3 IMPLANT
LOCATOR NERVE 3 VOLT (DISPOSABLE) IMPLANT
NEEDLE HYPO 25GX1X1/2 BEV (NEEDLE) IMPLANT
NS IRRIG 1000ML POUR BTL (IV SOLUTION) ×3 IMPLANT
PAD ARMBOARD 7.5X6 YLW CONV (MISCELLANEOUS) ×6 IMPLANT
PENCIL BUTTON HOLSTER BLD 10FT (ELECTRODE) ×3 IMPLANT
STAPLER VISISTAT 35W (STAPLE) IMPLANT
SUT ETHILON 3 0 PS 1 (SUTURE) IMPLANT
SUT ETHILON 4 0 P 3 18 (SUTURE) ×3 IMPLANT
SUT ETHILON 5 0 P 3 18 (SUTURE) ×8
SUT ETHILON 5 0 PS 2 18 (SUTURE) ×9 IMPLANT
SUT ETHILON 6 0 P 1 (SUTURE) ×3 IMPLANT
SUT NYLON ETHILON 5-0 P-3 1X18 (SUTURE) ×4 IMPLANT
SUT SILK 2 0 FS (SUTURE) IMPLANT
SUT SILK 3 0 REEL (SUTURE) IMPLANT
SUT VIC AB 3-0 PS2 18 (SUTURE)
SUT VIC AB 3-0 PS2 18XBRD (SUTURE) IMPLANT
SUT VIC AB 4-0 P-3 18X BRD (SUTURE) ×1 IMPLANT
SUT VIC AB 4-0 P3 18 (SUTURE) ×2
TOWEL OR 17X24 6PK STRL BLUE (TOWEL DISPOSABLE) ×3 IMPLANT
TRAY ENT MC OR (CUSTOM PROCEDURE TRAY) ×3 IMPLANT
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
## 2016-02-26 NOTE — Brief Op Note (Signed)
02/26/2016  8:11 PM  PATIENT:  Marzella Schleinliver B Stobaugh  30 y.o. male  PRE-OPERATIVE DIAGNOSIS:  Facial Laceration (25cm total length)  POST-OPERATIVE DIAGNOSIS:  Facial Laceration  PROCEDURE:  Procedure(s): FACIAL LACERATION REPAIR (N/A)  SURGEON:  Surgeon(s) and Role:    * Osborn Cohoavid Kirsty Monjaraz, MD - Primary  PHYSICIAN ASSISTANT:   ASSISTANTS: none   ANESTHESIA:   general  EBL:  No intake/output data recorded.  BLOOD ADMINISTERED:none  DRAINS: none   LOCAL MEDICATIONS USED:  NONE  SPECIMEN:  No Specimen  DISPOSITION OF SPECIMEN:  N/A  COUNTS:  YES  TOURNIQUET:  * No tourniquets in log *  DICTATION: .Other Dictation: Dictation Number [id number]  PLAN OF CARE: Discharge to home after PACU  PATIENT DISPOSITION:  PACU - hemodynamically stable.   Delay start of Pharmacological VTE agent (>24hrs) due to surgical blood loss or risk of bleeding: not applicable
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
## 2016-02-26 NOTE — ED Triage Notes (Signed)
See trauma notes

## 2016-02-26 NOTE — Anesthesia Preprocedure Evaluation (Signed)
Anesthesia Evaluation  Patient identified by MRN, date of birth, ID band Patient awake    Reviewed: Allergy & Precautions, NPO status , Patient's Chart, lab work & pertinent test results  Airway Mallampati: II  TM Distance: >3 FB Neck ROM: Full    Dental no notable dental hx.    Pulmonary neg pulmonary ROS, Current Smoker,    Pulmonary exam normal breath sounds clear to auscultation       Cardiovascular negative cardio ROS Normal cardiovascular exam Rhythm:Regular Rate:Normal     Neuro/Psych negative neurological ROS  negative psych ROS   GI/Hepatic negative GI ROS, Neg liver ROS,   Endo/Other  negative endocrine ROS  Renal/GU negative Renal ROS     Musculoskeletal negative musculoskeletal ROS (+)   Abdominal   Peds  Hematology negative hematology ROS (+)   Anesthesia Other Findings   Reproductive/Obstetrics                            Anesthesia Physical Anesthesia Plan  ASA: II  Anesthesia Plan: General   Post-op Pain Management:    Induction: Intravenous, Rapid sequence and Cricoid pressure planned  Airway Management Planned: Oral ETT  Additional Equipment:   Intra-op Plan:   Post-operative Plan: Extubation in OR  Informed Consent: I have reviewed the patients History and Physical, chart, labs and discussed the procedure including the risks, benefits and alternatives for the proposed anesthesia with the patient or authorized representative who has indicated his/her understanding and acceptance.   Dental advisory given  Plan Discussed with: CRNA  Anesthesia Plan Comments:         Anesthesia Quick Evaluation
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
## 2016-02-26 NOTE — Anesthesia Postprocedure Evaluation (Signed)
Anesthesia Post Note  Patient: Marzella SchleinOliver B Pham  Procedure(s) Performed: Procedure(s) (LRB): FACIAL LACERATION REPAIR (N/A)  Patient location during evaluation: PACU Anesthesia Type: General Level of consciousness: awake and alert Pain management: pain level controlled Vital Signs Assessment: post-procedure vital signs reviewed and stable Respiratory status: spontaneous breathing, nonlabored ventilation, respiratory function stable and patient connected to nasal cannula oxygen Cardiovascular status: blood pressure returned to baseline and stable Postop Assessment: no signs of nausea or vomiting Anesthetic complications: no    Last Vitals:  Vitals:   02/26/16 2038 02/26/16 2053  BP: (!) 141/91 (!) 142/95  Pulse: (!) 105 99  Resp: 20 12  Temp:      Last Pain:  Vitals:   02/26/16 2022  PainSc: Asleep                 Cecile HearingStephen Edward Peggie Hornak

## 2016-02-26 NOTE — ED Notes (Signed)
Pt returned from CT scan-- pt unable to move left leg, no sensation to left foot, leg -- below knee-- Dr. Erma HeritageIsaacs aware

## 2016-02-26 NOTE — Anesthesia Procedure Notes (Signed)
Procedure Name: Intubation Date/Time: 02/26/2016 6:16 PM Performed by: Coralee RudFLORES, Yeshua Stryker Pre-anesthesia Checklist: Patient identified, Emergency Drugs available, Suction available and Patient being monitored Patient Re-evaluated:Patient Re-evaluated prior to inductionOxygen Delivery Method: Circle system utilized Preoxygenation: Pre-oxygenation with 100% oxygen Intubation Type: IV induction Ventilation: Mask ventilation without difficulty Laryngoscope Size: Miller and 3 Grade View: Grade I Tube type: Oral Tube size: 7.5 mm Number of attempts: 1 Airway Equipment and Method: Stylet Secured at: 22 cm Tube secured with: Tape Dental Injury: Teeth and Oropharynx as per pre-operative assessment

## 2016-02-26 NOTE — ED Notes (Signed)
Report called to OR holding , [facility]

## 2016-02-26 NOTE — ED Notes (Signed)
Returned from CT scan.

## 2016-02-27 ENCOUNTER — Encounter (HOSPITAL_COMMUNITY): Payer: Self-pay | Admitting: Otolaryngology

## 2016-02-27 ENCOUNTER — Encounter: Payer: Self-pay | Admitting: Psychiatry

## 2016-02-27 LAB — SAMPLE TO BLOOD BANK
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
## 2016-08-31 ENCOUNTER — Emergency Department
Admission: EM | Admit: 2016-08-31 | Discharge: 2016-09-01 | Disposition: A | Discharge status: Home / Self Care | Payer: Self-pay | Attending: Emergency Medicine | Admitting: Emergency Medicine

## 2016-08-31 DIAGNOSIS — Y939 Activity, unspecified: Secondary | ICD-10-CM | POA: Insufficient documentation

## 2016-08-31 DIAGNOSIS — F17210 Nicotine dependence, cigarettes, uncomplicated: Secondary | ICD-10-CM | POA: Insufficient documentation

## 2016-08-31 DIAGNOSIS — Y999 Unspecified external cause status: Secondary | ICD-10-CM | POA: Insufficient documentation

## 2016-08-31 DIAGNOSIS — F1020 Alcohol dependence, uncomplicated: Secondary | ICD-10-CM | POA: Diagnosis present

## 2016-08-31 DIAGNOSIS — R45851 Suicidal ideations: Secondary | ICD-10-CM | POA: Insufficient documentation

## 2016-08-31 DIAGNOSIS — F1094 Alcohol use, unspecified with alcohol-induced mood disorder: Secondary | ICD-10-CM

## 2016-08-31 DIAGNOSIS — Z79899 Other long term (current) drug therapy: Secondary | ICD-10-CM | POA: Insufficient documentation

## 2016-08-31 DIAGNOSIS — W1789XA Other fall from one level to another, initial encounter: Secondary | ICD-10-CM | POA: Insufficient documentation

## 2016-08-31 DIAGNOSIS — F1910 Other psychoactive substance abuse, uncomplicated: Secondary | ICD-10-CM | POA: Insufficient documentation

## 2016-08-31 DIAGNOSIS — S0081XA Abrasion of other part of head, initial encounter: Secondary | ICD-10-CM | POA: Insufficient documentation

## 2016-08-31 DIAGNOSIS — Y929 Unspecified place or not applicable: Secondary | ICD-10-CM | POA: Insufficient documentation

## 2016-08-31 LAB — ETHANOL: Alcohol, Ethyl (B): 364 mg/dL (ref <5)

## 2016-08-31 LAB — COMPREHENSIVE METABOLIC PANEL
ALBUMIN: 4.8 g/dL (ref 3.5–5.0)
ALK PHOS: 76 U/L (ref 38–126)
ALT: 32 U/L (ref 17–63)
AST: 34 U/L (ref 15–41)
Anion gap: 10 (ref 5–15)
BILIRUBIN TOTAL: 0.5 mg/dL (ref 0.3–1.2)
BUN: 5 mg/dL — ABNORMAL LOW (ref 6–20)
CALCIUM: 8.9 mg/dL (ref 8.9–10.3)
CO2: 21 mmol/L — AB (ref 22–32)
CREATININE: 0.74 mg/dL (ref 0.61–1.24)
Chloride: 112 mmol/L — ABNORMAL HIGH (ref 101–111)
GFR calc Af Amer: >60 mL/min (ref >60)
GFR calc non Af Amer: >60 mL/min (ref >60)
GLUCOSE: 109 mg/dL — AB (ref 65–99)
Potassium: 3.4 mmol/L — ABNORMAL LOW (ref 3.5–5.1)
SODIUM: 143 mmol/L (ref 135–145)
TOTAL PROTEIN: 7.9 g/dL (ref 6.5–8.1)

## 2016-08-31 LAB — CBC WITH DIFFERENTIAL/PLATELET
BASOS PCT: 1 %
Basophils Absolute: 0.1 10*3/uL (ref 0–0.1)
EOS ABS: 0.1 10*3/uL (ref 0–0.7)
EOS PCT: 2 %
HEMATOCRIT: 52.7 % — AB (ref 40.0–52.0)
Hemoglobin: 17.7 g/dL (ref 13.0–18.0)
Lymphocytes Relative: 48 %
Lymphs Abs: 3 10*3/uL (ref 1.0–3.6)
MCH: 32.8 pg (ref 26.0–34.0)
MCHC: 33.7 g/dL (ref 32.0–36.0)
MCV: 97.3 fL (ref 80.0–100.0)
MONO ABS: 0.4 10*3/uL (ref 0.2–1.0)
MONOS PCT: 7 %
NEUTROS ABS: 2.6 10*3/uL (ref 1.4–6.5)
Neutrophils Relative %: 42 %
PLATELETS: 270 10*3/uL (ref 150–440)
RBC: 5.42 MIL/uL (ref 4.40–5.90)
RDW: 14.1 % (ref 11.5–14.5)
WBC: 6.2 10*3/uL (ref 3.8–10.6)

## 2016-08-31 LAB — URINE DRUG SCREEN, QUALITATIVE (ARMC ONLY)
Amphetamines, Ur Screen: NOT DETECTED
BARBITURATES, UR SCREEN: NOT DETECTED
Benzodiazepine, Ur Scrn: NOT DETECTED
CANNABINOID 50 NG, UR ~~LOC~~: NOT DETECTED
COCAINE METABOLITE, UR ~~LOC~~: NOT DETECTED
MDMA (Ecstasy)Ur Screen: NOT DETECTED
Methadone Scn, Ur: NOT DETECTED
OPIATE, UR SCREEN: NOT DETECTED
PHENCYCLIDINE (PCP) UR S: NOT DETECTED
Tricyclic, Ur Screen: NOT DETECTED

## 2016-08-31 LAB — SALICYLATE LEVEL

## 2016-08-31 LAB — ACETAMINOPHEN LEVEL: Acetaminophen (Tylenol), Serum: <10 ug/mL — ABNORMAL LOW (ref 10–30)

## 2016-08-31 MED ORDER — HALOPERIDOL LACTATE 5 MG/ML IJ SOLN
INTRAMUSCULAR | Status: AC
  Filled 2016-08-31: qty 1

## 2016-08-31 MED ORDER — HALOPERIDOL LACTATE 5 MG/ML IJ SOLN
5.0000 mg | Freq: Once | INTRAMUSCULAR | Status: AC
  Administered 2016-08-31: 5 mg via INTRAMUSCULAR

## 2016-08-31 MED ORDER — LORAZEPAM 2 MG/ML IJ SOLN
2.0000 mg | Freq: Once | INTRAMUSCULAR | Status: AC
  Administered 2016-08-31: 2 mg via INTRAMUSCULAR
  Filled 2016-08-31: qty 1

## 2016-08-31 MED ORDER — HALOPERIDOL LACTATE 5 MG/ML IJ SOLN
5.0000 mg | Freq: Once | INTRAMUSCULAR | Status: AC
  Administered 2016-08-31: 5 mg via INTRAMUSCULAR
  Filled 2016-08-31: qty 1

## 2016-08-31 NOTE — ED Notes (Signed)
In & out cath performed with Mardelle MatteAndy, Medic
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
## 2016-08-31 NOTE — ED Notes (Signed)
Patient is drowsy, rolling on stretcher, occasionally spitting. Patient is still undressed. ED medic, security, [place] PD at bedside.

## 2016-08-31 NOTE — ED Notes (Signed)
Patient is sleeping in a supine position. Mardelle MatteAndy, ED Medic is the sitter. [employer] managersecurity personnel present.

## 2016-08-31 NOTE — ED Notes (Signed)
Patient is under IVC. ED Medic sitter, security, and charge RN aware.

## 2016-08-31 NOTE — ED Notes (Signed)
Patient became agitated again, tore off behavioral scrubs. Dr. Cyril LoosenKinner aware. Haldol 5mg  IM given. [place] PD and Dr. Toni Amendlapacs at bedside.

## 2016-08-31 NOTE — ED Notes (Signed)
Report given to Mary RN.

## 2016-08-31 NOTE — ED Notes (Signed)
Patient has good results with IM medication. Patient is cooperative with clothing change. Patient is lying prone on stretcher, arousable, but dozing.

## 2016-08-31 NOTE — ED Notes (Signed)
PT IVC PENDING DISPOSITION  
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
## 2016-09-01 DIAGNOSIS — F1094 Alcohol use, unspecified with alcohol-induced mood disorder: Secondary | ICD-10-CM

## 2016-09-01 NOTE — ED Notes (Signed)
Pt has paper scrubs back on and sitting on side of bed. Pt calm, cooperative and able to speak in complete sentences.

## 2016-09-01 NOTE — BH Assessment (Signed)
Assessment Note  Logan Wilkins is an 31 y.o. male. He came in after being found in his car, while intoxicated, in a parking lot.  When asked how much he had to drink the previous day, he reported he had "one screw driver" and that was it.  However, the patient blood alcohol level 364 when he was admitted.  The patient reported he drinks about 2-3 times a week and drinks about 6 beers.  The patient did not remember coming to the hospital and how he got there.    However, the patient knew he was in a hospital, but wasn't sure which hospital and was not aware of the time.  He reported he has to go to court tomorrow for a DUI.  He reported he blew a 100. He denied SI/HI and psychosis.  The patient denied any previous suicide attempts.  He lives with his wife and children.  Diagnosis: Alcohol use disorder  Past Medical History:  Past Medical History:  Diagnosis Date  . Opiate abuse, episodic    states has been clean for 3 -4 months    Past Surgical History:  Procedure Laterality Date  . FACIAL LACERATION REPAIR N/A 02/26/2016   Procedure: FACIAL LACERATION REPAIR;  Surgeon: Osborn Coho, MD;  Location: [facility] OR;  Service: ENT;  Laterality: N/A;    Family History: No family history on file.  Social History:  reports that he has been smoking Cigarettes.  He has been smoking about 4.00 packs per day. He has never used smokeless tobacco. He reports that he drinks alcohol. He reports that he uses drugs, including IV and Marijuana.  Additional Social History:  Alcohol / Drug Use Pain Medications: see PTA Prescriptions: see PTA Over the Counter: see PTA History of alcohol / drug use?: Yes Longest period of sobriety (when/how long): unknown  Negative Consequences of Use: Legal, Personal relationships Withdrawal Symptoms: Other (Comment) (none reported) Substance #1 Name of Substance 1: alcohol 1 - Age of First Use: unknown 1 - Amount (size/oz): unknown 1 - Frequency: unknown 1 - Duration:  unknown 1 - Last Use / Amount: 08/31/2016  CIWA: CIWA-Ar BP: 127/83 Pulse Rate: (!) 105 COWS:    Allergies:  Allergies  Allergen Reactions  . Sulfa Antibiotics Swelling  . Sulfur     "cant pee"    Home Medications:  (Not in a hospital admission)  OB/GYN Status:  No LMP for male patient.  General Assessment Data Location of Assessment: [facility] ED TTS Assessment: In system Is this a Tele or Face-to-Face Assessment?: Face-to-Face Is this an Initial Assessment or a Re-assessment for this encounter?: Initial Assessment Marital status: Married [place] name: NA Is patient pregnant?: Other (Comment) (NA) Pregnancy Status: Other (Comment) (NA) Living Arrangements: Spouse/significant other, Children Can pt return to current living arrangement?: Yes Admission Status: Involuntary Is patient capable of signing voluntary admission?: Yes Referral Source: Self/Family/Friend Insurance type: none     Crisis Care Plan Living Arrangements: Spouse/significant other, Children Legal Guardian: Other: (NA) Name of Psychiatrist: none Name of Therapist: none  Education Status Is patient currently in school?: No Current Grade: na Highest grade of school patient has completed: na Name of school: na Contact person: na  Risk to self with the past 6 months Suicidal Ideation: No Has patient been a risk to self within the past 6 months prior to admission? : No Suicidal Intent: No Has patient had any suicidal intent within the past 6 months prior to admission? : No Is patient at  risk for suicide?: No Suicidal Plan?: No Has patient had any suicidal plan within the past 6 months prior to admission? : Yes Access to Means: No What has been your use of drugs/alcohol within the last 12 months?: The client reported he drinkgs 2-3 days a week.  Previous Attempts/Gestures: No How many times?: 0 Other Self Harm Risks: alcohol use Triggers for Past Attempts: None known Intentional Self Injurious  Behavior: None Family Suicide History: Unknown Recent stressful life event(s): Other (Comment) (recent move) Persecutory voices/beliefs?: No Depression: No Depression Symptoms: Guilt Substance abuse history and/or treatment for substance abuse?: Yes Suicide prevention information given to non-admitted patients: Yes  Risk to Others within the past 6 months Homicidal Ideation: No Does patient have any lifetime risk of violence toward others beyond the six months prior to admission? : Yes (comment) (Client came in very combative (yelling).) Thoughts of Harm to Others: No Current Homicidal Intent: No Current Homicidal Plan: No Access to Homicidal Means: No Identified Victim: NA History of harm to others?: No Assessment of Violence: On admission Violent Behavior Description: none Does patient have access to weapons?: No Criminal Charges Pending?: Yes Describe Pending Criminal Charges: DUI Does patient have a court date: Yes Court Date: 09/02/16 (for DUI) Is patient on probation?: Unknown  Psychosis Hallucinations: None noted Delusions: None noted  Mental Status Report Appearance/Hygiene: Unremarkable, In scrubs Eye Contact: Good Motor Activity: Unremarkable Speech: Logical/coherent Level of Consciousness: Alert Mood: Pleasant Affect: Appropriate to circumstance Anxiety Level: Minimal Thought Processes: Relevant Judgement: Impaired Orientation: Situation, Person Obsessive Compulsive Thoughts/Behaviors: None        Prior Inpatient Therapy Prior Inpatient Therapy: Yes Prior Therapy Dates: 2015 Prior Therapy Facilty/Provider(s): [facility] Reason for Treatment: depression  Prior Outpatient Therapy Prior Outpatient Therapy: Yes Prior Therapy Dates: 2015 Prior Therapy Facilty/Provider(s): [facility] Reason for Treatment: depression Does patient have an ACCT team?: No Does patient have Intensive In-House Services?  : No Does patient have [facility] services? : No Does patient  have P4CC services?: No          Abuse/Neglect Assessment (Assessment to be complete while patient is alone) Physical Abuse: Denies Verbal Abuse: Denies Sexual Abuse: Denies Exploitation of patient/patient's resources: Denies Self-Neglect: Denies Values / Beliefs Cultural Requests During Hospitalization: None Spiritual Requests During Hospitalization: None Consults Spiritual Care Consult Needed: No Social Work Consult Needed: No      Additional Information 1:1 In Past 12 Months?: No CIRT Risk: No Elopement Risk: No Does patient have medical clearance?: Yes  Child/Adolescent Assessment Running Away Risk: Denies (Adult)  Disposition:  Disposition Initial Assessment Completed for this Encounter: Yes Disposition of Patient: Other dispositions (to be assessed by MD)  On Site Evaluation by:   Reviewed with Physician:    Ottis StainGarvin, Kamea Dacosta Jermaine 09/01/2016 12:51 PM

## 2016-09-01 NOTE — ED Provider Notes (Signed)
-----------------------------------------   2:52 AM on 09/01/2016 -----------------------------------------   Blood pressure 100/62, pulse 78, temperature 97.8 F (36.6 C), temperature source Oral, resp. rate 16, SpO2 98 %.  The patient had no acute events since last update.  Calm and cooperative at this time.  Disposition is pending Psychiatry/Behavioral Medicine team recommendations.     Merrily Brittleifenbark, Elizabet Schweppe, MD 09/01/16 [phone]

## 2016-09-01 NOTE — ED Notes (Signed)
Pt woke up and told breakfast tray was here. Pt went back to sleep.

## 2016-09-01 NOTE — Consult Note (Signed)
[place] [facility] Consult   Reason for Consult:  Consult for 31 year old man who was brought to the emergency room by law enforcement yesterday afternoon extremely intoxicated and belligerent Referring Physician:  Corky Downs Patient Identification: Logan Wilkins MRN:  [id number] Principal Diagnosis: Alcohol-induced mood disorder [facility]) Diagnosis:   Patient Active Problem List   Diagnosis Date Noted  . Alcohol-induced mood disorder ([place]) [F10.94] 09/01/2016  . unspecified depressive disorder [F32.9] 12/19/2015  . Alcohol use disorder, severe, dependence ([place]) [F10.20] 12/19/2015  . Opioid use disorder, severe, dependence ([place]) [F11.20] 12/19/2015  . Opioid withdrawal ([place]) [F11.23] 12/19/2015  . Cannabis use disorder, severe, dependence ([place]) [F12.20] 12/19/2015  . Tobacco use disorder [F17.200] 12/19/2015    Total Time spent with patient: 1 hour  Subjective:   Logan Wilkins is a 31 y.o. male patient admitted with "I have no idea".  HPI:  Patient interviewed chart reviewed. 31 year old man with a history of substance abuse was brought to the emergency room yesterday afternoon by law enforcement. The circumstances of them picking him up are not clear. The patient was extremely belligerent and agitated grossly intoxicated and fighting in the emergency room yesterday. I witnessed him aggressively fighting with staff and requiring several officers to safely restrain him for medication. Today however the patient has no memory of any of that. He says the last thing he remembers was that he was at a park with his children yesterday when he went to lie down in the car to take a nap in the next thing he knows police were dragging him into the hospital. He claims to only remember having 1 alcoholic drink yesterday afternoon. Denies any other drug use. Patient claims that he does not drink very frequently at all. Says he hasn't been using drugs and months. He denies any mood symptoms that he is  aware of. Chronic poor sleep at night. Chronic poor appetite. Totally denies suicidal thoughts or psychosis or homicidal ideation. Not currently receiving any outpatient psychiatric treatment.  Medical history: He has a little scrape on his chin but he has no memory of how it got there. Otherwise he seems to be in pretty reasonable medical health without any ongoing medical problems.  Social history: Patient says he lives with his wife and 3 children. 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  Substance abuse history: Patient has a history of opiate dependence. Prior admission to the hospital last summer for drug abuse. Also abusing alcohol at that time. No history of suicide attempts or violence otherwise. He denies any history of DTs or seizures  Past Psychiatric History: Only previous psych treatment was for substance abuse with behavior associated with intoxication.  Risk to Self: Suicidal Ideation: No Suicidal Intent: No Is patient at risk for suicide?: No Suicidal Plan?: No Access to Means: No What has been your use of drugs/alcohol within the last 12 months?: The client reported he drinkgs 2-3 days a week.  How many times?: 0 Other Self Harm Risks: alcohol use Triggers for Past Attempts: None known Intentional Self Injurious Behavior: None Risk to Others: Homicidal Ideation: No Thoughts of Harm to Others: No Current Homicidal Intent: No Current Homicidal Plan: No Access to Homicidal Means: No Identified Victim: NA History of harm to others?: No Assessment of Violence: On admission Violent Behavior Description: none Does patient have access to weapons?: No Criminal Charges Pending?: Yes Describe Pending  Criminal Charges: DUI Does patient have a court date: Yes Court Date: 09/02/16 (for DUI) Prior Inpatient Therapy: Prior Inpatient Therapy: Yes Prior Therapy Dates: 2015 Prior Therapy  Facilty/Provider(s): [facility] Reason for Treatment: depression Prior Outpatient Therapy: Prior Outpatient Therapy: Yes Prior Therapy Dates: 2015 Prior Therapy Facilty/Provider(s): [facility] Reason for Treatment: depression Does patient have an ACCT team?: No Does patient have Intensive In-House Services?  : No Does patient have [facility] services? : No Does patient have P4CC services?: No  Past Medical History:  Past Medical History:  Diagnosis Date  . Opiate abuse, episodic    states has been clean for 3 -4 months    Past Surgical History:  Procedure Laterality Date  . FACIAL LACERATION REPAIR N/A 02/26/2016   Procedure: FACIAL LACERATION REPAIR;  Surgeon: Jerrell Belfast, MD;  Location: [facility] OR;  Service: ENT;  Laterality: N/A;   Family History: No family history on file. Family Psychiatric  History: He denies being aware of any Social History:  History  Alcohol Use  . Yes    Comment: last drink last night     History  Drug Use  . Types: IV, Marijuana    Comment: has not used x 3-4 months    Social History   Social History  . Marital status: Married    Spouse name: N/A  . Number of children: N/A  . Years of education: N/A   Social History Main Topics  . Smoking status: Current Every Day Smoker    Packs/day: 4.00    Types: Cigarettes  . Smokeless tobacco: Never Used  . Alcohol use Yes     Comment: last drink last night  . Drug use: Yes    Types: IV, Marijuana     Comment: has not used x 3-4 months  . Sexual activity: Not on file   Other Topics Concern  . Not on file   Social History Narrative   ** Merged History Encounter **       Additional Social History:    Allergies:   Allergies  Allergen Reactions  . Sulfa Antibiotics Swelling  . Sulfur     "cant pee"    Labs:  Results for orders placed or performed during the hospital encounter of 08/31/16 (from the past 48 hour(s))  Comprehensive metabolic panel     Status: Abnormal   Collection Time: 08/31/16   4:25 PM  Result Value Ref Range   Sodium 143 135 - 145 mmol/L   Potassium 3.4 (L) 3.5 - 5.1 mmol/L   Chloride 112 (H) 101 - 111 mmol/L   CO2 21 (L) 22 - 32 mmol/L   Glucose, Bld 109 (H) 65 - 99 mg/dL   BUN 5 (L) 6 - 20 mg/dL   Creatinine, Ser 0.74 0.61 - 1.24 mg/dL   Calcium 8.9 8.9 - 10.3 mg/dL   Total Protein 7.9 6.5 - 8.1 g/dL   Albumin 4.8 3.5 - 5.0 g/dL   AST 34 15 - 41 U/L   ALT 32 17 - 63 U/L   Alkaline Phosphatase 76 38 - 126 U/L   Total Bilirubin 0.5 0.3 - 1.2 mg/dL   GFR calc non Af Amer >60 >60 mL/min   GFR calc Af Amer >60 >60 mL/min    Comment: (NOTE) The eGFR has been calculated using the CKD EPI equation. This calculation has not been validated in all clinical situations. eGFR's persistently <60 mL/min signify possible Chronic Kidney Disease.    Anion gap 10 5 - 15  Ethanol  Status: Abnormal   Collection Time: 08/31/16  4:25 PM  Result Value Ref Range   Alcohol, Ethyl (B) 364 (HH) <5 mg/dL    Comment: CRITICAL RESULT CALLED TO, READ BACK BY AND VERIFIED WITH MARY NEEDHAM ON 08/31/16 AT 1714 [place]        LOWEST DETECTABLE LIMIT FOR SERUM ALCOHOL IS 5 mg/dL FOR MEDICAL PURPOSES ONLY   CBC with Diff     Status: Abnormal   Collection Time: 08/31/16  4:25 PM  Result Value Ref Range   WBC 6.2 3.8 - 10.6 K/uL   RBC 5.42 4.40 - 5.90 MIL/uL   Hemoglobin 17.7 13.0 - 18.0 g/dL   HCT 52.7 (H) 40.0 - 52.0 %   MCV 97.3 80.0 - 100.0 fL   MCH 32.8 26.0 - 34.0 pg   MCHC 33.7 32.0 - 36.0 g/dL   RDW 14.1 11.5 - 14.5 %   Platelets 270 150 - 440 K/uL   Neutrophils Relative % 42 %   Neutro Abs 2.6 1.4 - 6.5 K/uL   Lymphocytes Relative 48 %   Lymphs Abs 3.0 1.0 - 3.6 K/uL   Monocytes Relative 7 %   Monocytes Absolute 0.4 0.2 - 1.0 K/uL   Eosinophils Relative 2 %   Eosinophils Absolute 0.1 0 - 0.7 K/uL   Basophils Relative 1 %   Basophils Absolute 0.1 0 - 0.1 K/uL  Acetaminophen level     Status: Abnormal   Collection Time: 08/31/16  4:25 PM  Result Value Ref Range    Acetaminophen (Tylenol), Serum <10 (L) 10 - 30 ug/mL    Comment:        THERAPEUTIC CONCENTRATIONS VARY SIGNIFICANTLY. A RANGE OF 10-30 ug/mL MAY BE AN EFFECTIVE CONCENTRATION FOR MANY PATIENTS. HOWEVER, SOME ARE BEST TREATED AT CONCENTRATIONS OUTSIDE THIS RANGE. ACETAMINOPHEN CONCENTRATIONS >150 ug/mL AT 4 HOURS AFTER INGESTION AND >50 ug/mL AT 12 HOURS AFTER INGESTION ARE OFTEN ASSOCIATED WITH TOXIC REACTIONS.   Salicylate level     Status: None   Collection Time: 08/31/16  4:25 PM  Result Value Ref Range   Salicylate Lvl <4.2 2.8 - 30.0 mg/dL  Urine Drug Screen, Qualitative     Status: None   Collection Time: 08/31/16  5:03 PM  Result Value Ref Range   Tricyclic, Ur Screen NONE DETECTED NONE DETECTED   Amphetamines, Ur Screen NONE DETECTED NONE DETECTED   MDMA (Ecstasy)Ur Screen NONE DETECTED NONE DETECTED   Cocaine Metabolite,Ur [place] NONE DETECTED NONE DETECTED   Opiate, Ur Screen NONE DETECTED NONE DETECTED   Phencyclidine (PCP) Ur S NONE DETECTED NONE DETECTED   Cannabinoid 50 Ng, Ur [place] NONE DETECTED NONE DETECTED   Barbiturates, Ur Screen NONE DETECTED NONE DETECTED   Benzodiazepine, Ur Scrn NONE DETECTED NONE DETECTED   Methadone Scn, Ur NONE DETECTED NONE DETECTED    Comment: (NOTE) 595  Tricyclics, urine               Cutoff 1000 ng/mL 200  Amphetamines, urine             Cutoff 1000 ng/mL 300  MDMA (Ecstasy), urine           Cutoff 500 ng/mL 400  Cocaine Metabolite, urine       Cutoff 300 ng/mL 500  Opiate, urine                   Cutoff 300 ng/mL 600  Phencyclidine (PCP), urine      Cutoff 25 ng/mL 700  Cannabinoid,  urine              Cutoff 50 ng/mL 800  Barbiturates, urine             Cutoff 200 ng/mL 900  Benzodiazepine, urine           Cutoff 200 ng/mL 1000 Methadone, urine                Cutoff 300 ng/mL 1100 1200 The urine drug screen provides only a preliminary, unconfirmed 1300 analytical test result and should not be used for non-medical 1400  purposes. Clinical consideration and professional judgment should 1500 be applied to any positive drug screen result due to possible 1600 interfering substances. A more specific alternate chemical method 1700 must be used in order to obtain a confirmed analytical result.  1800 Gas chromato graphy / mass spectrometry (GC/MS) is the preferred 1900 confirmatory method.     No current facility-administered medications for this encounter.    Current Outpatient Prescriptions  Medication Sig Dispense Refill  . amoxicillin-clavulanate (AUGMENTIN) 500-125 MG tablet Take 1 tablet (500 mg total) by mouth 2 (two) times daily. 20 tablet 0  . hydrocortisone cream 1 % Apply topically 4 (four) times daily. 30 g 0  . hydrOXYzine (ATARAX/VISTARIL) 25 MG tablet Take 1 tablet (25 mg total) by mouth every 4 (four) hours as needed for itching. 15 tablet 0  . SUBOXONE 8-2 MG FILM Take 1 each by mouth daily.  0    Musculoskeletal: Strength & Muscle Tone: within normal limits Gait & Station: normal Patient leans: N/A  Psychiatric Specialty Exam: Physical Exam  Nursing note and vitals reviewed. Constitutional: He appears well-developed and well-nourished.  HENT:  Head: Normocephalic and atraumatic.  Eyes: Conjunctivae are normal. Pupils are equal, round, and reactive to light.  Neck: Normal range of motion.  Cardiovascular: Regular rhythm and normal heart sounds.   Respiratory: Effort normal. No respiratory distress.  GI: Soft.  Musculoskeletal: Normal range of motion.  Neurological: He is alert.  Skin: Skin is warm and dry.     Psychiatric: His speech is normal. His affect is blunt. He is slowed. Thought content is not paranoid. Cognition and memory are impaired. He expresses impulsivity. He expresses no homicidal and no suicidal ideation. He exhibits abnormal recent memory and abnormal remote memory.    Review of Systems  Constitutional: Negative.   HENT: Negative.   Eyes: Negative.     Respiratory: Negative.   Cardiovascular: Negative.   Gastrointestinal: Negative.   Musculoskeletal: Negative.   Skin: Negative.   Neurological: Negative.   Psychiatric/Behavioral: Negative for depression, hallucinations, memory loss, substance abuse and suicidal ideas. The patient is not nervous/anxious and does not have insomnia.     Blood pressure 127/83, pulse (!) 105, temperature 97.8 F (36.6 C), temperature source Oral, resp. rate 16, SpO2 99 %.There is no height or weight on file to calculate BMI.  General Appearance: Disheveled  Eye Contact:  Fair  Speech:  Slow  Volume:  Decreased  Mood:  Euthymic  Affect:  Constricted  Thought Process:  Goal Directed  Orientation:  Full (Time, Place, and Person)  Thought Content:  Logical  Suicidal Thoughts:  No  Homicidal Thoughts:  No  Memory:  Immediate;   Fair Recent;   Poor Remote;   Poor  Judgement:  Impaired  Insight:  Lacking  Psychomotor Activity:  Decreased  Concentration:  Concentration: Fair  Recall:  [employer] of Knowledge:  Fair  Language:  Fair  Akathisia:  No  Handed:  Right  AIMS (if indicated):     Assets:  Communication Skills Housing Physical Health Resilience  ADL's:  Intact  Cognition:  WNL  Sleep:        Treatment Plan Summary: Plan 31 year old man presented to the emergency room yesterday grossly intoxicated. At the time I witnessed him making statements about how he wished that he were dead and screaming about his daughter. When I confronted him with that information today he said he had no memory of any of that. Minimizes his alcohol use. Denies any other mood symptoms. Totally denies suicidal ideation. Appears to be sober and calm today. No longer meets commitment criteria. Patient educated about blackouts and the dangerousness they obviously represent an strongly encouraged to reevaluate whether his alcohol use is a problem. He can be given information about local mental health resources for  substance abuse treatment but otherwise can be released from the emergency room. Discontinue involuntary commitment. Case reviewed with TTS and emergency room physician.  Disposition: Patient does not meet criteria for psychiatric inpatient admission. Supportive therapy provided about ongoing stressors.  Alethia Berthold, MD 09/01/2016 1:17 PM

## 2016-09-01 NOTE — ED Provider Notes (Signed)
Patient has been cleared by psychiatry for discharge. He has not suicidal at this time.   Logan Wilkins, Logan Priego E, MD 09/01/16 [phone]

## 2016-09-01 NOTE — ED Notes (Addendum)
Pt up to bathroom, refusing to put clothing on. Pt asked to step back into room and put paper scrubs back on.

## 2020-04-13 ENCOUNTER — Emergency Department
Admission: EM | Admit: 2020-04-13 | Discharge: 2020-04-13 | Disposition: A | Discharge status: Home / Self Care | Payer: PRIVATE HEALTH INSURANCE | Attending: Emergency Medicine | Admitting: Emergency Medicine

## 2020-04-13 ENCOUNTER — Emergency Department: Payer: PRIVATE HEALTH INSURANCE

## 2020-04-13 ENCOUNTER — Other Ambulatory Visit: Payer: Self-pay

## 2020-04-13 ENCOUNTER — Encounter: Payer: Self-pay | Admitting: Emergency Medicine

## 2020-04-13 DIAGNOSIS — L03114 Cellulitis of left upper limb: Secondary | ICD-10-CM | POA: Insufficient documentation

## 2020-04-13 DIAGNOSIS — L02512 Cutaneous abscess of left hand: Secondary | ICD-10-CM

## 2020-04-13 DIAGNOSIS — M65042 Abscess of tendon sheath, left hand: Secondary | ICD-10-CM | POA: Diagnosis not present

## 2020-04-13 DIAGNOSIS — W231XXA Caught, crushed, jammed, or pinched between stationary objects, initial encounter: Secondary | ICD-10-CM | POA: Diagnosis not present

## 2020-04-13 DIAGNOSIS — Z79899 Other long term (current) drug therapy: Secondary | ICD-10-CM | POA: Insufficient documentation

## 2020-04-13 DIAGNOSIS — S6722XA Crushing injury of left hand, initial encounter: Secondary | ICD-10-CM | POA: Insufficient documentation

## 2020-04-13 DIAGNOSIS — F17210 Nicotine dependence, cigarettes, uncomplicated: Secondary | ICD-10-CM | POA: Insufficient documentation

## 2020-04-13 DIAGNOSIS — Z23 Encounter for immunization: Secondary | ICD-10-CM | POA: Insufficient documentation

## 2020-04-13 DIAGNOSIS — S6992XA Unspecified injury of left wrist, hand and finger(s), initial encounter: Secondary | ICD-10-CM

## 2020-04-13 LAB — CBC
HCT: 42 % (ref 39.0–52.0)
Hemoglobin: 14.3 g/dL (ref 13.0–17.0)
MCH: 31 pg (ref 26.0–34.0)
MCHC: 34 g/dL (ref 30.0–36.0)
MCV: 91.1 fL (ref 80.0–100.0)
Platelets: 256 10*3/uL (ref 150–400)
RBC: 4.61 MIL/uL (ref 4.22–5.81)
RDW: 12.2 % (ref 11.5–15.5)
WBC: 10.7 10*3/uL — ABNORMAL HIGH (ref 4.0–10.5)
nRBC: 0 % (ref 0.0–0.2)

## 2020-04-13 LAB — BASIC METABOLIC PANEL
Anion gap: 10 (ref 5–15)
BUN: 12 mg/dL (ref 6–20)
CO2: 24 mmol/L (ref 22–32)
Calcium: 9 mg/dL (ref 8.9–10.3)
Chloride: 101 mmol/L (ref 98–111)
Creatinine, Ser: 0.77 mg/dL (ref 0.61–1.24)
GFR, Estimated: >60 mL/min (ref >60)
Glucose, Bld: 139 mg/dL — ABNORMAL HIGH (ref 70–99)
Potassium: 4.1 mmol/L (ref 3.5–5.1)
Sodium: 135 mmol/L (ref 135–145)

## 2020-04-13 MED ORDER — HYDROCODONE-ACETAMINOPHEN 5-325 MG PO TABS
1.0000 | ORAL_TABLET | ORAL | Status: AC
  Administered 2020-04-13: 1 via ORAL
  Filled 2020-04-13: qty 1

## 2020-04-13 MED ORDER — TETANUS-DIPHTH-ACELL PERTUSSIS 5-2.5-18.5 LF-MCG/0.5 IM SUSY
0.5000 mL | PREFILLED_SYRINGE | Freq: Once | INTRAMUSCULAR | Status: AC
  Administered 2020-04-13: 0.5 mL via INTRAMUSCULAR
  Filled 2020-04-13: qty 0.5

## 2020-04-13 MED ORDER — DEXTROSE 5 % IV SOLN
1500.0000 mg | Freq: Once | INTRAVENOUS | Status: AC
  Administered 2020-04-13: 1500 mg via INTRAVENOUS
  Filled 2020-04-13: qty 75

## 2020-04-13 MED ORDER — HYDROCODONE-ACETAMINOPHEN 5-325 MG PO TABS
1.0000 | ORAL_TABLET | ORAL | Status: AC
Start: 2020-04-13 — End: 2020-04-13
  Administered 2020-04-13: 1 via ORAL
  Filled 2020-04-13: qty 1

## 2020-04-13 MED ORDER — LIDOCAINE HCL (PF) 1 % IJ SOLN
2.0000 mL | Freq: Once | INTRAMUSCULAR | Status: AC
  Administered 2020-04-13: 2 mL
  Filled 2020-04-13: qty 5
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
## 2020-04-13 NOTE — Discharge Instructions (Signed)
Please keep wound clean and dry.  Please call Dr. Cristine Polio office Monday morning to schedule a follow-up appointment for end of next week for recheck.  Please remove iodoform packing on Monday, 04/15/2020 and apply clean dry sterile dressing.  Return to the ER for any increasing pain swelling warmth or redness

## 2020-04-13 NOTE — ED Notes (Signed)
Patient received Augmentin 875 mg x1 day and Ibuprofen once while in jail per [place], [place] at jail   [phone] [facility] jail

## 2020-04-13 NOTE — ED Notes (Signed)
lidocaine (PF) (XYLOCAINE) 1 % injection 2 mL; Aerobic/Anaerobic Culture (surgical/deep wound), placed in patient room for Dr. Floyce Stakes
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
## 2020-04-14 LAB — AEROBIC/ANAEROBIC CULTURE (SURGICAL/DEEP WOUND)

## 2020-04-15 LAB — AEROBIC/ANAEROBIC CULTURE (SURGICAL/DEEP WOUND)

## 2020-04-17 LAB — AEROBIC/ANAEROBIC CULTURE (SURGICAL/DEEP WOUND)

## 2020-04-18 LAB — AEROBIC/ANAEROBIC CULTURE W GRAM STAIN (SURGICAL/DEEP WOUND)

## 2020-04-22 ENCOUNTER — Telehealth: Payer: Self-pay

## 2020-04-22 NOTE — Telephone Encounter (Signed)
Called to schedule urgent referral from ED for hand infection. Mother reveals patient in [facility]. Advised her to call us when he is released and we will see him. She is also calling jail nurse to discuss.
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
